# Patient Record
Sex: Female | Born: 1960
Health system: Southern US, Community
[De-identification: ages and names within clinical notes are randomized; demographics above are authoritative.]

## PROBLEM LIST (undated history)

## (undated) DIAGNOSIS — R109 Unspecified abdominal pain: Secondary | ICD-10-CM

## (undated) DIAGNOSIS — M51369 Other intervertebral disc degeneration, lumbar region without mention of lumbar back pain or lower extremity pain: Secondary | ICD-10-CM

## (undated) DIAGNOSIS — L439 Lichen planus, unspecified: Secondary | ICD-10-CM

## (undated) DIAGNOSIS — E119 Type 2 diabetes mellitus without complications: Secondary | ICD-10-CM

## (undated) DIAGNOSIS — E559 Vitamin D deficiency, unspecified: Secondary | ICD-10-CM

## (undated) DIAGNOSIS — M5136 Other intervertebral disc degeneration, lumbar region: Secondary | ICD-10-CM

## (undated) DIAGNOSIS — R1031 Right lower quadrant pain: Secondary | ICD-10-CM

## (undated) DIAGNOSIS — R42 Dizziness and giddiness: Secondary | ICD-10-CM

## (undated) DIAGNOSIS — I83813 Varicose veins of bilateral lower extremities with pain: Secondary | ICD-10-CM

## (undated) DIAGNOSIS — E162 Hypoglycemia, unspecified: Secondary | ICD-10-CM

## (undated) DIAGNOSIS — R002 Palpitations: Secondary | ICD-10-CM

## (undated) DIAGNOSIS — I10 Essential (primary) hypertension: Secondary | ICD-10-CM

## (undated) DIAGNOSIS — D219 Benign neoplasm of connective and other soft tissue, unspecified: Secondary | ICD-10-CM

## (undated) DIAGNOSIS — M797 Fibromyalgia: Secondary | ICD-10-CM

## (undated) DIAGNOSIS — F419 Anxiety disorder, unspecified: Secondary | ICD-10-CM

## (undated) DIAGNOSIS — Z83719 Family history of colon polyps, unspecified: Secondary | ICD-10-CM

## (undated) DIAGNOSIS — K219 Gastro-esophageal reflux disease without esophagitis: Secondary | ICD-10-CM

## (undated) DIAGNOSIS — E041 Nontoxic single thyroid nodule: Secondary | ICD-10-CM

## (undated) DIAGNOSIS — R079 Chest pain, unspecified: Secondary | ICD-10-CM

## (undated) DIAGNOSIS — K5909 Other constipation: Secondary | ICD-10-CM

## (undated) DIAGNOSIS — R1011 Right upper quadrant pain: Secondary | ICD-10-CM

## (undated) DIAGNOSIS — H35033 Hypertensive retinopathy, bilateral: Secondary | ICD-10-CM

## (undated) DIAGNOSIS — N3281 Overactive bladder: Secondary | ICD-10-CM

## (undated) DIAGNOSIS — Z8371 Family history of colonic polyps: Secondary | ICD-10-CM

## (undated) DIAGNOSIS — G894 Chronic pain syndrome: Secondary | ICD-10-CM

## (undated) DIAGNOSIS — M1990 Unspecified osteoarthritis, unspecified site: Secondary | ICD-10-CM

## (undated) DIAGNOSIS — R600 Localized edema: Secondary | ICD-10-CM

## (undated) DIAGNOSIS — R609 Edema, unspecified: Secondary | ICD-10-CM

## (undated) HISTORY — DX: Varicose veins of bilateral lower extremities with pain: I83.813

## (undated) HISTORY — DX: Chest pain, unspecified: R07.9

## (undated) HISTORY — DX: Type 2 diabetes mellitus without complications: E11.9

## (undated) HISTORY — DX: Essential (primary) hypertension: I10

## (undated) HISTORY — PX: OTHER SURGICAL HISTORY: SHX169

## (undated) HISTORY — PX: HYSTEROSCOPY: SHX211

## (undated) HISTORY — DX: Anxiety disorder, unspecified: F41.9

## (undated) HISTORY — DX: Overactive bladder: N32.81

## (undated) HISTORY — DX: Morbid (severe) obesity due to excess calories: E66.01

## (undated) HISTORY — DX: Other constipation: K59.09

## (undated) HISTORY — DX: Gastro-esophageal reflux disease without esophagitis: K21.9

## (undated) HISTORY — DX: Benign neoplasm of connective and other soft tissue, unspecified: D21.9

## (undated) HISTORY — DX: Family history of colonic polyps: Z83.71

## (undated) HISTORY — DX: Nontoxic single thyroid nodule: E04.1

## (undated) HISTORY — DX: Other intervertebral disc degeneration, lumbar region without mention of lumbar back pain or lower extremity pain: M51.369

## (undated) HISTORY — DX: Chronic pain syndrome: G89.4

## (undated) HISTORY — DX: Unspecified abdominal pain: R10.9

## (undated) HISTORY — DX: Palpitations: R00.2

## (undated) HISTORY — DX: Vitamin D deficiency, unspecified: E55.9

## (undated) HISTORY — DX: Family history of colon polyps, unspecified: Z83.719

## (undated) HISTORY — DX: Right lower quadrant pain: R10.31

## (undated) HISTORY — DX: Edema, unspecified: R60.9

## (undated) HISTORY — DX: Right upper quadrant pain: R10.11

## (undated) HISTORY — DX: Hypoglycemia, unspecified: E16.2

## (undated) HISTORY — DX: Hypertensive retinopathy, bilateral: H35.033

## (undated) HISTORY — DX: Localized edema: R60.0

## (undated) HISTORY — DX: Other intervertebral disc degeneration, lumbar region: M51.36

---

## 1999-10-19 ENCOUNTER — Encounter: Payer: Self-pay | Admitting: Family Medicine

## 1999-10-19 ENCOUNTER — Ambulatory Visit (HOSPITAL_COMMUNITY): Admission: RE | Admit: 1999-10-19 | Discharge: 1999-10-19 | Payer: Self-pay | Admitting: Family Medicine

## 2000-08-14 ENCOUNTER — Other Ambulatory Visit: Admission: RE | Admit: 2000-08-14 | Discharge: 2000-08-14 | Payer: Self-pay | Admitting: Obstetrics and Gynecology

## 2000-11-22 ENCOUNTER — Ambulatory Visit (HOSPITAL_COMMUNITY): Admission: RE | Admit: 2000-11-22 | Discharge: 2000-11-22 | Payer: Self-pay | Admitting: Family Medicine

## 2000-11-22 ENCOUNTER — Encounter: Payer: Self-pay | Admitting: Family Medicine

## 2001-11-03 ENCOUNTER — Encounter: Payer: Self-pay | Admitting: Orthopaedic Surgery

## 2001-11-03 ENCOUNTER — Encounter: Admission: RE | Admit: 2001-11-03 | Discharge: 2001-11-03 | Payer: Self-pay | Admitting: Orthopaedic Surgery

## 2002-05-15 ENCOUNTER — Other Ambulatory Visit: Admission: RE | Admit: 2002-05-15 | Discharge: 2002-05-15 | Payer: Self-pay | Admitting: Obstetrics and Gynecology

## 2002-08-24 ENCOUNTER — Encounter (INDEPENDENT_AMBULATORY_CARE_PROVIDER_SITE_OTHER): Payer: Self-pay | Admitting: Specialist

## 2002-08-24 ENCOUNTER — Inpatient Hospital Stay (HOSPITAL_COMMUNITY): Admission: RE | Admit: 2002-08-24 | Discharge: 2002-08-26 | Payer: Self-pay | Admitting: Obstetrics and Gynecology

## 2006-04-22 ENCOUNTER — Encounter: Admission: RE | Admit: 2006-04-22 | Discharge: 2006-04-22 | Payer: Self-pay | Admitting: Obstetrics and Gynecology

## 2006-06-05 ENCOUNTER — Encounter: Admission: RE | Admit: 2006-06-05 | Discharge: 2006-06-05 | Payer: Self-pay | Admitting: Neurology

## 2006-11-29 ENCOUNTER — Encounter
Admission: RE | Admit: 2006-11-29 | Discharge: 2007-02-27 | Payer: Self-pay | Admitting: Physical Medicine & Rehabilitation

## 2006-11-29 ENCOUNTER — Ambulatory Visit: Payer: Self-pay | Admitting: Physical Medicine & Rehabilitation

## 2006-12-02 ENCOUNTER — Ambulatory Visit (HOSPITAL_COMMUNITY)
Admission: RE | Admit: 2006-12-02 | Discharge: 2006-12-02 | Payer: Self-pay | Admitting: Physical Medicine & Rehabilitation

## 2006-12-03 ENCOUNTER — Encounter: Admission: RE | Admit: 2006-12-03 | Discharge: 2006-12-25 | Payer: Self-pay | Admitting: Rheumatology

## 2006-12-24 ENCOUNTER — Ambulatory Visit (HOSPITAL_COMMUNITY)
Admission: RE | Admit: 2006-12-24 | Discharge: 2006-12-24 | Payer: Self-pay | Admitting: Physical Medicine & Rehabilitation

## 2006-12-30 ENCOUNTER — Ambulatory Visit: Payer: Self-pay | Admitting: Physical Medicine & Rehabilitation

## 2007-01-03 ENCOUNTER — Ambulatory Visit: Payer: Self-pay | Admitting: Physical Medicine & Rehabilitation

## 2007-02-05 ENCOUNTER — Ambulatory Visit: Payer: Self-pay | Admitting: Physical Medicine & Rehabilitation

## 2007-02-05 ENCOUNTER — Encounter
Admission: RE | Admit: 2007-02-05 | Discharge: 2007-05-06 | Payer: Self-pay | Admitting: Physical Medicine & Rehabilitation

## 2007-02-19 ENCOUNTER — Ambulatory Visit: Payer: Self-pay | Admitting: Physical Medicine & Rehabilitation

## 2010-06-13 NOTE — Assessment & Plan Note (Signed)
HISTORY:  Jessica [name] returns today.  She had a lumbar epidural syringe  injection December 24, 2006 by a paramedian approach.  No significant  lower extremity improvements.  She is a bit discouraged because of no  perceivable actual improvement.  She does not tolerate narcotic  analgesic medications any stronger than Darvocet.  In fact, Darvocet  makes her tired at N-100 dosage.  Her pain is described as sharp,  burning, dull, stabbing, constant, tingling, aching, currently at 6/10  averaging 8/10 interfering with activity at 7/10 levels.  Sleep is poor-  fair.  Pain improves with rest, heat, pacing activities.  She walks 20  minutes at a time.  She continues to ride a stationary bicycle.  She  gives a goal of returning to work with some limitations or restrictions.  Her pain interferes with meal preparation duties and shopping.  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   REVIEW OF SYSTEMS:  Positive for numbness, tingling, trouble walking,  spasms, depression, anxiety, constipation, abdominal pain and limb  swelling.   SOCIAL HISTORY:  Married.  Lives with her husband and 3 children.   FAMILY HISTORY:  Heart disease, diabetes, high blood pressure.   PHYSICAL EXAMINATION:  GENERAL:  No acute distress.  Mood and affect  appears depressed.  BACK:  Tenderness to palpation bilateral paraspinal.  Her gait shows no  evidence of dragging debility.   IMPRESSION:  Lumbar spinal stenosis.  I did review her MRI images on the  computer as well as the report.  She does have moderate stenosis due to  some shortened pedicles and some facet arthropathy.  She may get some  intermittent radicular symptoms due to this.  Because of this, I think  she can use intermittent sitting interspersed with her walking, just  sitting a few minutes every half hour should help with this.  Certainly  she does have an overlay of her fibromyalgia and this is playing into  perceived disability.   PLAN:  1. As I  discussed with the patient, she really needs about another 3      acupuncture visits to fully assess this modality in terms of its      efficacy.  2. Change Darvocet from N-100 to N-50 and increase to q.i.d.  3. I did write her for alternating standing/sitting and no lifting      greater than 20 pounds in terms of work restrictions.  4. She asked me to get her a permanent handicap placard, however, I do      not think that she has sufficient claudication symptoms to warrant      this at the current time.  Should she progress, she may require      this and also consider surgical opinion.      Erick Colace, M.D.  Electronically Signed     AEK/MedQ  D:  01/14/2007 12:18:36  T:  01/14/2007 17:36:10  Job #:  [id number]   cc:   Jessica Skains, MD

## 2010-06-13 NOTE — Group Therapy Note (Signed)
REASON FOR CONSULTATION:  Back pain, knee pain as well as fibromyalgia  pain.   HISTORY:  The patient is a 50 year old female who gives a history of  pain all over the body dating back at least 6 years.   She had initial consultation with Rheumatology in 2005 and at that time  was diagnosed with fibromyalgia syndrome.  In 2001 she did have problems  with knee pain and underwent knee joint arthroscopic surgery for a torn  meniscus.  Her pain spread to other joints and at that point she did  present to Rheumatology.  No other inflammatory rheumatologic condition  was diagnosed at that time.   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  She has in the past gone to [facility] but  is already scheduled to go to [facility].  Her main  complaints today are back pain as well as knee pain.  She has had some  back and hip x-rays at Dr. Fatima Sanger office.  She has had knee x-rays  in the past but it has been years since they were done.   MEDICATIONS TRIED:  She has been on Elavil, Mobic, Flexeril, Wellbutrin,  Lyrica.   CURRENT MEDS:  1. Neurontin 300 mg daily.  2. Cymbalta 60 mg a day.  3. Flexeril 10 mg t.i.d. p.r.n.  4. Darvocet one p.o. up to 4 times per day p.r.n.  5. Butalbital up to q.i.d. for headache.   NON-PAIN MEDICATIONS:  Allegra, meclizine and Maxzide.   ALLERGIES:  1. MORPHINE.  2. FENTANYL.  3. DEMEROL.  4. VICODIN.  5. PERCOCET.  6. ULTRAM.  7. CORTISONE INJECTIONS.  8. LYRICA.  LYRICA CAUSES EDEMA OF THE HANDS AND FEET.   CURRENT EXERCISE PROGRAM:  She does do some treadmill.  She has done  some swimming in the past but no aquatic therapy per se.   She has a walking tolerance of 20 to 30 minutes, but beyond this seems  to aggravate her pain.   FUNCTIONAL REVIEW:  Meal prep, household duties, shopping are difficult  for her because of pain in the back and knees.   REVIEW OF SYSTEMS:   Positive for trouble walking, spasms, dizziness,  confusion, depression, numbness, tingling, constipation, abdominal pain,  poor appetite, limb swelling and coughing as well as weight gain.   PAST SURGICAL HISTORY:  1. C-section 1993, 1998.  2. Hysterectomy in 2004.   SOCIAL HISTORY:  1. Married.  2. Lives with her husband and 3 children.   FAMILY HISTORY:  1. Heart disease.  2. Diabetes.  3. Hypertension.   EXAMINATION:  Blood pressure 133/66, pulse 72, respirations 18, O2 sat  99% on room air.  GENERAL:  No acute distress.  Mood and affect appropriate.  Obese  female.  She has negative palpation for tender points of fibromyalgia.  She has  good knee range of motion, no evidence of effusion.  She does have some  valgus deformity bilateral knees.  BACK:  Has some tenderness to palpation lumbar paraspinals.  Her forward  flexion is 50% forward flexion and 50% extension.  Her deep tendon  reflexes are 1+ bilateral biceps, triceps, brachioradialis, patellar and  ankle.  Her hip range of motion is good.  Her pulses in her feet are  normal, foot temperature and skin color are normal, nailbeds are normal.   IMPRESSION:  Chronic back pain as well as knee pain.  The back pain  appears to be more axial and some radiation into the thighs, this could  be either facet syndrome sacroiliac joint and less likely diskogenic,  although that still remains in the differential.   She has had no MRI.  She has had recent x-rays but I do not have those  results.  Looking back, she has had pelvic x-rays showing some  sacroiliac sclerotic margins, mild sacroiliitis diagnosed in 2003.  She  has had thoracic spine films.  She has had a bone scan which indicated  some increased activity in the sacroiliac joint area.   Knee films in 2001 were unremarkable.   PLAN:  1. Agree with physical therapy, will monitor program, make medication      adjustments as needed, avoid narcotic analgesics given  multiple      allergies.  Discussed acupuncture as potential treatment modality.      She will check into her insurance benefits __________ does perform.  2. I will give her some samples of Flector patch she can use over her      low back area, see if this is helpful for her, on a b.i.d. basis,      especially as she is going through physical therapy.  3. Should her back pain not improve would check lumbar MRI and      consider sacroiliac injections.  4. In terms of her fibromyalgia, she may benefit from an aquatic      exercise program . Fibromyalgia Does not appear to be active at      this time.      Erick Colace, M.D.  Electronically Signed     AEK/MedQ  D:  12/02/2006 10:07:02  T:  12/02/2006 17:20:55  Job #:  [id number]   cc:   Pollyann Savoy, M.D.  Fax: [phone]   Melida Quitter, M.D.  Fax: [phone]

## 2010-06-13 NOTE — Procedures (Signed)
[name], Jessica [name]               ACCOUNT NO.:  [id number]   MEDICAL RECORD NO.:  [id number]          PATIENT TYPE:  OUT   LOCATION:  XRAY                         FACILITY:  [facility]   PHYSICIAN:  Erick Colace, M.D.DATE OF BIRTH:  Dec 01, 1960   DATE OF PROCEDURE:  12/23/2006  DATE OF DISCHARGE:  12/02/2006                               OPERATIVE REPORT   PROCEDURE:  Acupuncture treatment.   DESCRIPTION OF PROCEDURE:  Needles placed:  Bilateral BL was moved, 21BL  was moved, 23BL was moved, 25BL, 27, as well as GB30 bilaterally.  4  hertz stimulation x30 minutes.  The patient tolerated the procedure  well.  She will return in 1 week for repeat treatment.      Erick Colace, M.D.  Electronically Signed     AEK/MEDQ  D:  12/23/2006 10:13:51  T:  12/23/2006 11:28:34  Job:  [id number]

## 2010-06-13 NOTE — Assessment & Plan Note (Signed)
A 50 year old female who gives history of pain all over his body.  Diagnosed with fibromyalgia in 2005.  Has had knee pain, torn meniscus  in the past.  I sent her for x-rays of her knee.  She showed moderate  degree of DJD for her age.   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  Dr. Corliss Skains apparently had her off  work.  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  She has had no problems with  activities of daily living other than meal prep and shopping and  household duties.   REVIEW OF SYSTEMS:  Thighs are tingling, trouble walking, spasms,  dizziness, anxiety, night sweats, constipation, coughing at night.   She is currently out since 11/22/06 and was started on reduced schedule  since 04/2006.   Her back has no tenderness to palpation.  She has some back pain with  Farber's maneuver on the left side greater than the right side.  She has  had previous x-rays showing sacral iliac sclerosis.   She has pain with extension of the lumbar spine but not with flexion.   PHYSICAL EXAMINATION:  VITALS:  Blood pressure 142/65,  pulse 71,  respirations 18, O2 sat 100% on room air.  GENERAL:  No acute distress.  Mood and affect appropriate.  SKIN:  Normal.  EXTREMITIES: Gait is normal.  MUSCULOSKELETAL:  Body habitus obese, she has valgus deformities at the  knees.  She has some crepitus at the knees but no evidence of effusion,  erythema or induration.   IMPRESSION:  1. Fibromyalgia syndrome.  I think this is her overall pain process.      Did tell her that I do not think that this is  a long term      disabling condition.  She is unable to take any narcotic analgesics      other than Darvocet which makes her drowsy certainly we can look      for other non medication alternatives and I have suggested      acupuncture trial which she would like to try.  2. Back pain.  Has evidence of 2003 x-ray of sacral iliac sclerosis.      May have some facet arthropathy or degenerative disk.  Check an       MRI.  3. Knee pain with moderate DJD.  This is not a major complaint.  Do      not think she needs injections for it. Have counseled weight loss      as probably the best thing she can do for her knees.   Agreed to write a note that she is undergoing pain management she should  reach maximal medical improvement in approximately 1 month after which I  do not think she will need any restrictions since she has undergone PT  and some other treatments allowing her to focus on these over the next  month.      Erick Colace, M.D.  Electronically Signed     AEK/MedQ  D:  12/12/2006 14:51:55  T:  12/13/2006 07:03:16  Job #:  [id number]

## 2010-06-13 NOTE — Procedures (Signed)
Jessica [name], Jessica [name]               ACCOUNT NO.:  [id number]   MEDICAL RECORD NO.:  [id number]           PATIENT TYPE:   LOCATION:                                 FACILITY:   PHYSICIAN:  Erick Colace, M.D.DATE OF BIRTH:  1960-04-17   DATE OF PROCEDURE:  DATE OF DISCHARGE:                               OPERATIVE REPORT   PROCEDURE:  Acupuncture treatment.   OPERATOR:   NEEDLES PLACED/DESCRIPTION OF PROCEDURE:  Bilateral BL21, BL23, BL25 and  BL27, as well as GB30 prime bilaterally, for her skin x30 minutes.  The  patient tolerated the procedure well.   FOLLOWUP:  She is to return in one week.      Erick Colace, M.D.  Electronically Signed     AEK/MEDQ  D:  12/16/2006 09:25:21  T:  12/16/2006 12:56:06  Job:  [id number]

## 2010-06-13 NOTE — Procedures (Signed)
Jessica [name], Jessica [name]               ACCOUNT NO.:  [id number]   MEDICAL RECORD NO.:  [id number]          PATIENT TYPE:  REC   LOCATION:  TPC                          FACILITY:  [facility]   PHYSICIAN:  Erick Colace, M.D.DATE OF BIRTH:  09-17-1960   DATE OF PROCEDURE:  01/20/2007  DATE OF DISCHARGE:                               OPERATIVE REPORT   PROCEDURE:  Acupuncture treatment.   Needles were placed bilaterally at BL21, BL23, BL25, BL27, and GB30.  Each 4 Hertz x 30 minutes.  The patient tolerated the procedure well.      Erick Colace, M.D.  Electronically Signed     AEK/MEDQ  D:  01/20/2007 16:07:33  T:  01/20/2007 23:11:15  Job:  [id number]

## 2010-06-13 NOTE — Procedures (Signed)
NAMEVERNICA, Jessica [name] NO.:  [id number]   MEDICAL RECORD NO.:  [id number]          PATIENT TYPE:  OUT   LOCATION:  XRAY                         FACILITY:  [facility]   PHYSICIAN:  Erick Colace, M.D.DATE OF BIRTH:  [birth year]   DATE OF PROCEDURE:  DATE OF DISCHARGE:  12/24/2006                               OPERATIVE REPORT   PROCEDURE:  Lumbar epidural steroid injection, L4-5 translaminar, right  paramedian approach.   Because of history of itching that she associated with prior hip  corticosteroid injection, she was given 50 mg and Benadryl IM.  This was  after she had received informed consent which explained risks and  benefits of the procedure including bleeding, bruising, infection,  temporary or permanent paralysis.  She elects to proceed and has given  written consent.  The patient placed prone on fluoroscopy table.  Betadine prep, sterile drape.  25-gauge 1-1/2-inch needle was used to  anesthetize the skin and subcutaneous tissue with 1% lidocaine x2 mL.  Then an 18-gauge 6-inche spinal needle was inserted under fluoroscopic  guidance and AP lateral imaging.  Loss of resistance technique was  employed after inferior lamina of L4 was reached and needle was directed  inferiorly.  After loss of resistance was obtained, Omnipaque 180  demonstrated epidural spread, mainly right-sided, no intravascular  uptake, and then a solution containing 1 mL of 1% lidocaine, 1.5 mL of  40 mg/mL Depo-Medrol, and 1 mL of 0.9 normal saline, preservative-free,  were injected.  The patient tolerated the procedure well.  Postinjection  vitals stable.  Postinjection instructions given.  I will see her back  in 1 week.  She has an acupuncture appointment.      Erick Colace, M.D.  Electronically Signed     AEK/MEDQ  D:  01/06/2007 13:00:10  T:  01/06/2007 16:28:30  Job:  [id number]

## 2010-06-13 NOTE — Assessment & Plan Note (Signed)
The patient returns today.  She follows up from a trial of acupuncture.  We did a total of seven visits, last one on February 06, 2007.  She has  not had any significant relief with the acupuncture treatment.  Her pain  is twofold, one is the low back pain that radiates into the hips and  thighs, and then she has the all over body pain that she has had related  to her fibromyalgia.  Her current pain is 5/10 but averages at 9/10.  She feels she cannot work due to her pain.  Her pain does improve  briefly with exercise for about an hour post exercise.  She has been  doing exercises such as water aerobics as well as some aerobic activity.   Other medications tried in the past that she is unable to tolerate  include morphine, fentanyl, Demerol, Vicodin, Percocet, Ultram,  cortisone injection, and Lyrica.  She did, however, tolerate epidural  steroid injection which did not cause any adverse side effects.  She has  had some swelling with Neurontin.   She last worked November 22, 2006.  She states that she sometimes needs  help with meal prep, shopping, and household duties.   REVIEW OF SYSTEMS:  14-point health and history performed, please  review.  Pertinent positives include numbness in the feet and hands,  tingling, trouble walking fast, and confusion, depression and anxiety,  but negative for suicidal thoughts.  She has had some weight gain,  nausea, constipation, abdominal pain, coughing at night, as well as  swelling in her legs.   SOCIAL HISTORY:  She is married and lives with her husband and three  children.   CURRENT MEDICATIONS:  1. Cymbalta 60 mg a day.  2. Flexeril 10 mg up to three at night.  3. Darvocet-N 50 that she takes q.i.d.   PHYSICAL EXAMINATION:  GENERAL:  Morbidly obese female in no acute  distress.  Mood and affect are appropriate, although a bit frustrated.  Gait; no toe drag or knee instability, widened base support due to thigh  circumference.  She has  tenderness in the fibromyalgia tender points up  the traps, sternocostal, low back, hip, but not in the elbows or knees.  She has pain with extension of her lumbar spine.  She has full forward  flexion without pain of her lumbar spine.  She has normal strength in  the deltoid, biceps, triceps, grip, as well as hip flexor, knee  extension, and ankle dorsiflexor.  She has normal deep tendon reflexes  bilateral lower and upper extremities.   IMPRESSION:  Chronic pain which can be attributed to two causes:  1. Overlying causes of compartment myalgia syndrome for all over body      pain.  She is doing aerobic activity as is required using very      small dose of narcotic analgesic, i.e., Darvocet N 50.  She is on      Cymbalta.  She cannot tolerate Lyrica.  She also cannot tolerate      Neurontin even though Topamax has not been really well studied in      fibromyalgia population, it may have some beneficial effect in this      situation and may have an added effect of some weight loss.  We      will start with 25 q.h.s. and gradually increase to 50 q.h.s. next      time I see her and then start daytime dosages.  2. No  further acupuncture treatments are needed.  3. In terms of lumbar stenosis, this is partially related to facet      arthropathy and this may be causing her pain with hyperextension.      The stenosis is in the mild to moderate range would not recommend      surgical referral at this point.  She may benefit from lumbar      medial branch blocks.  We discussed return to work.  I do think she      can      do a sedentary type job.  She does not feel she can.  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  I will see her back for the      lumbar medial branch blocks in two weeks.      Erick Colace, M.D.  Electronically Signed     AEK/MedQ  D:  02/20/2007 17:41:14  T:  02/20/2007 23:08:02  Job #:  [id number]   cc:   Kathryne Hitch, MD  Fax: [phone]

## 2010-06-13 NOTE — Assessment & Plan Note (Signed)
[name] returns today, she has lumbar pain and lower extremity pain.  She had a recent lumbar MRI and would like to review this today.  She  has had some relief from Flector patches in terms of her back pain, this  has not helped her foot pain.  She describes her pain as sharp, burning,  intermittent, dull stabbing, and tingling, aching.  She has all over  body pain, but also more particular pain in the low back and buttock and  posterior thigh area.  Her sleep is fair.  Pain improves with rest,  heat, therapy, pacing activity, TENS unit.  Increased with walking and  prolonged sitting and prolonged standing.  Pain interferes with meal  prep, household duties and shopping.   REVIEW OF SYSTEMS:  Positive for trouble walking, spasms, dizziness,  depression, anxiety.  She also has constipation, and extremity swelling  on review.   SOCIAL HISTORY:  Married.  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  Last worked 11/22/2006.   EXAMINATION:  LOWER EXTREMITY:  Lower extremity strength is normal.  Gait shows no evidence of toe dragging or instability.  No lower  extremity swelling noted.  Her pulses are normal in lower extremities.  Sensation is hyperesthetic in the feet compared to the knees.  DEEP TENDON REFLEXES:  Normal.   Review of MRI shows bilateral facet joint change at L3-4; congenitally  short pedicles; mild to moderate spinal stenosis at L3-4 level, small  right posterior lateral disk protrusion.  At L4-5, she has ligamentum  flavum hypertrophied in conjunction with facet degenerative changes, and  short pedicles; moderate stenosis at this level with mild bilateral  foraminal narrowing.  L5-S1, moderate facet joint changes, mild bulge,  lateral recess stenosis, and mild bilateral foraminal narrowing.   IMPRESSION:  Lumbar spinal stenosis, lower extremity numbness can be  attributed to radicular symptoms.  She is already getting some  acupuncture for her low back and  she has multiple medication allergies  including basically all narcotic analgesics, except for Darvocet.  She  is taking Neurontin already as well as Cymbalta.  Will schedule for an  L4-5 translaminar epidural steroid injection.  She had some itching  after a hip cortisone injection.  Will give her IM Benadryl prior to the  injection and she can take p.o. Benadryl when she gets home and for 24  hours afterwards.  Her prior itching episode after corticosteroid  improved after 24 hours of Benadryl p.o. only.      Jessica [name], M.D.  Electronically Signed     AEK/MedQ  D:  12/31/2006 11:26:56  T:  12/31/2006 12:39:42  Job #:  [id number]

## 2010-06-13 NOTE — Procedures (Signed)
[name], Jessica [name]               ACCOUNT NO.:  [id number]   MEDICAL RECORD NO.:  [id number]           PATIENT TYPE:   LOCATION:                                 FACILITY:   PHYSICIAN:  Erick Colace, M.D.DATE OF BIRTH:  08/11/1960   DATE OF PROCEDURE:  DATE OF DISCHARGE:                               OPERATIVE REPORT   PROCEDURE:  Acupuncture treatment.   OPERATOR:  Dr. Erick Colace.   DESCRIPTION OF PROCEDURE:  The needles were placed bilaterally at BL-21,  BL-23, BL-25, BL-27, GB-30 and BL-37, 4 Hz stim x30 minutes.  The  patient tolerated the procedure well.      Erick Colace, M.D.  Electronically Signed     AEK/MEDQ  D:  12/31/2006 11:21:53  T:  12/31/2006 11:52:28  Job:  [id number]

## 2010-06-13 NOTE — Procedures (Signed)
Jessica [name], Jessica [name]               ACCOUNT NO.:  [id number]   MEDICAL RECORD NO.:  [id number]          PATIENT TYPE:  REC   LOCATION:  TPC                          FACILITY:  [facility]   PHYSICIAN:  Erick Colace, M.D.DATE OF BIRTH:  [birth year]   DATE OF PROCEDURE:  02/06/2007  DATE OF DISCHARGE:                               OPERATIVE REPORT   PROCEDURE:  Acupuncture treatment #7.   PROCEDURE:  Needle was placed bilaterally at BL-21, BL-23, BL-25, BL-27,  BL-52, GB-30, and DU-4 using 2 Hz x30 minutes.  The patient tolerated  the procedure well and will return in one week.   INDICATIONS FOR PROCEDURE:  Lumbar spinal stenosis as well as  arthromyalgia unresponsive to medical management.      Erick Colace, M.D.  Electronically Signed     AEK/MEDQ  D:  02/06/2007 09:52:30  T:  02/06/2007 10:52:36  Job:  [id number]

## 2010-06-13 NOTE — Assessment & Plan Note (Signed)
Jessica [name] is a 50 year old female with pain all over her body.  Diagnosed with fibromyalgia syndrome.      Erick Colace, M.D.  Electronically Signed     AEK/MedQ  D:  12/12/2006 16:27:01  T:  12/13/2006 11:27:27  Job #:  [id number]

## 2010-06-13 NOTE — Procedures (Signed)
[name], [name] NO.:  [id number]   MEDICAL RECORD NO.:  [id number]          PATIENT TYPE:  OUT   LOCATION:  XRAY                         [facility]ACILITY:  [facility]   PHYSICIAN:  Erick Colace, M.D.DATE OF BIRTH:  [birth year]   DATE OF PROCEDURE:  01/14/2007  DATE OF DISCHARGE:                               OPERATIVE REPORT   PROCEDURE:  Acupuncture treatment, number 4.   This treatment is for lumbar spinal stenosis with resulting back and hip  pain.  The patient was placed on exam table in a prone position.  Needles were placed bilaterally at BL21, BL23, BL25, BL27.  Also, GB30  bilaterally.  Each stim 4 hertz x30 minutes.  The patient tolerated the  procedure well.      Erick Colace, M.D.  Electronically Signed     AEK/MEDQ  D:  01/14/2007 12:13:53  T:  01/14/2007 15:45:21  Job:  [id number]

## 2010-06-13 NOTE — Procedures (Signed)
NAMEETHLEEN, LORMAND               ACCOUNT NO.:  [id number]   MEDICAL RECORD NO.:  [id number]           PATIENT TYPE:   LOCATION:                                 FACILITY:   PHYSICIAN:  Erick Colace, M.D.DATE OF BIRTH:  July 02, 1960   DATE OF PROCEDURE:  01/28/2007  DATE OF DISCHARGE:                               OPERATIVE REPORT   ACUPUNCTURE TREATMENT:  Six needles were placed bilaterally at BL 21, BL  23, BL 25,  BL 27,  GB 30 as well as right Shen-men to her stimulation  times 30 minutes.  The patient tolerated procedure well.  Return in 1  week.  Pain level today 7/10.  Pain last visit was 8/10.      Erick Colace, M.D.  Electronically Signed     AEK/MEDQ  D:  01/28/2007 10:06:48  T:  01/28/2007 10:37:47  Job:  [id number]

## 2010-06-16 NOTE — Discharge Summary (Signed)
   NAMEAKIERA, Jessica [name]                         ACCOUNT NO.:  [id number]   MEDICAL RECORD NO.:  [id number]                   PATIENT TYPE:  INP   LOCATION:  9304                                 FACILITY:  [facility]   PHYSICIAN:  Cynthia P. Romine, M.D.             DATE OF BIRTH:  October 29, 1960   DATE OF ADMISSION:  08/24/2002  DATE OF DISCHARGE:  08/26/2002                                 DISCHARGE SUMMARY   DISCHARGE DIAGNOSIS:  Menorrhagia and uterine fibroids.   PROCEDURE:  Total abdominal hysterectomy.   HISTORY:  This is a 50 year old black female who complained of menorrhagia  unresponsive to medical management, was known to have uterine fibroid, who  was admitted to undergo total abdominal hysterectomy.  On August 24, 2002 she  underwent a total abdominal hysterectomy under general endotracheal  anesthesia.  Estimated blood loss was 400 mL; there were no complications.  Postoperatively she did very well and had an uneventful postoperative  course, and was in satisfactory condition for discharge on postoperative day  #2.  She was given full discharge instructions regarding pelvic rest, follow-  up in the office in six weeks.  She was given a prescription for Toradol  tablets to take one p.o. q.6h. p.r.n. pain, was advised to follow up in the  office in six weeks.  Pathology report showed leiomyomata and benign  secretory endometrium.  The uterus was 14 x 10 x 8 cm and weighed 473 grams.   LABORATORY DATA:  Admission H&H was 10 and 34, and on discharge 9 and 28.                                                Cynthia P. Romine, M.D.    CPR/MEDQ  D:  09/14/2002  T:  09/15/2002  Job:  [id number]

## 2010-06-16 NOTE — Op Note (Signed)
Jessica [name], Jessica [name]                         ACCOUNT NO.:  [id number]   MEDICAL RECORD NO.:  [id number]                   PATIENT TYPE:  INP   LOCATION:  9304                                 FACILITY:  [facility]   PHYSICIAN:  Cynthia P. Romine, M.D.             DATE OF BIRTH:  21-Oct-1960   DATE OF PROCEDURE:  08/24/2002  DATE OF DISCHARGE:                                 OPERATIVE REPORT   PREOPERATIVE DIAGNOSES:  1. Menorrhagia.  2. Uterine fibroids.  3. Anemia.   POSTOPERATIVE DIAGNOSES:  1. Menorrhagia.  2. Uterine fibroids.  3. Anemia.  4. Pathology pending.   PROCEDURE:  Total abdominal hysterectomy.   SURGEON:  Cynthia P. Romine, M.D.   ASSISTANT:  Laqueta Linden, M.D.   ANESTHESIA:  General endotracheal anesthesia.   ESTIMATED BLOOD LOSS:  400 mL.   COMPLICATIONS:  None.   DESCRIPTION OF PROCEDURE:  The patient was taken to the operating room and  after the induction of adequate general endotracheal anesthesia, was prepped  and draped in the usual fashion and Foley catheter inserted.  A Pfannenstiel  incision was made at the site of the previous two Pfannenstiels.  The  incision was carried down to the fascia with the Bovie.  The fascia was  nicked and opened transversely using the Mayo scissors.  Kochers were used  to grasp the fascia __________ underlying rectus muscles with sharp  dissection.  Rectus muscles were separated sharply in the midline.  The  underlying peritoneum was elevated and entered atraumatically.  The  peritoneum was opened vertically.  The abdomen was explored.  The liver edge  was smooth.  There was no pelvic or periaortic adenopathy.  In the pelvis,  the uterus was enlarged to about 12-week size.  Fundus was firm, consistent  with a known submucous myoma of 5 cm.  The ovaries and tubes appeared  normal.  Long Kellys were placed at the uterine cornua to elevate the uterus  out of the pelvis.  The round ligaments were sutured on each side  and  divided with the Bovie. Anterior leaf of the broad ligament was taken down  sharply.  Some adhesions of the bladder up to the anterior cervical uterus  were also taken down sharply.  The pedicle containing tube and utero-ovarian  ligament was clamped on each side, cut and doubly tied, achieving good  hemostasis.  The posterior leaf of the broad ligament was also taken down  sharply.  Ureters were identified bilaterally and were intact.  The bladder  was dissected free off the cervix.  The uterine arteries were skeletonized,  clamped, cut and doubly tied.  The cardinal ligament was clamped, cut and  tied down to the level of the uterosacral ligaments which were clamped, cut  and tied and held as separate pedicle.  The vagina was entered anteriorly  and Mayo scissors were used to remove the specimen and  specimen was sent to  pathology.  Kochers were used to grasp the margins of the vagina.  Angled  sutures were placed on the right and left starting from the inside of the  vagina through the uterosacral ligament pedicle and then back to the inside  of the vagina and tying.  The vagina was the closed with running sutures of  figure-of-eight using 0 chromic.  The ovaries were then tied up to the round  ligaments on each side to elevate the ovaries out of the cul-de-sac.  Ovarian pedicles were hemostatic.  The pelvis was inspected.  Again, the  ureters were identified and felt to be normal. There was some bleeding along  the anterior margins of the bladder flap on the patient's left which was  controlled with two figure-of-eight sutures of 2-0 chromic.  The pelvis was  irrigated and felt to be hemostatic.  The packs were removed.  The  peritoneum was closed with a running suture of 2-0 chromic.  Fascia was  closed with a running suture of 0 Vicryl going from each angle toward the  midline.  The on-Q subfascial catheter had been placed prior to closing the  fascia.  After closing the fascia,  the subcutaneous catheter of the on-Q  system was placed.  The subcutaneous tissue was irrigated and was  hemostatic.  The skin was closed with subcuticular stitch of 3-0 Vicryl  Rapide.  Steri-Strips were placed.  Op-Sites were placed over the on-Q  catheters and dressing was applied.  The patient tolerated the procedure  well and went in satisfactory condition to post anesthesia recovery.                                               Cynthia P. Romine, M.D.    CPR/MEDQ  D:  08/24/2002  T:  08/24/2002  Job:  [id number]

## 2011-01-08 ENCOUNTER — Other Ambulatory Visit: Payer: Self-pay | Admitting: Family Medicine

## 2011-01-08 DIAGNOSIS — Z1231 Encounter for screening mammogram for malignant neoplasm of breast: Secondary | ICD-10-CM

## 2011-01-09 ENCOUNTER — Ambulatory Visit
Admission: RE | Admit: 2011-01-09 | Discharge: 2011-01-09 | Disposition: A | Discharge status: Home / Self Care | Payer: Self-pay | Source: Ambulatory Visit | Attending: Family Medicine | Admitting: Family Medicine

## 2011-01-09 DIAGNOSIS — Z1231 Encounter for screening mammogram for malignant neoplasm of breast: Secondary | ICD-10-CM

## 2011-12-08 ENCOUNTER — Emergency Department (INDEPENDENT_AMBULATORY_CARE_PROVIDER_SITE_OTHER): Payer: Medicare PPO

## 2011-12-08 ENCOUNTER — Emergency Department (HOSPITAL_COMMUNITY)
Admission: EM | Admit: 2011-12-08 | Discharge: 2011-12-08 | Disposition: A | Discharge status: Home / Self Care | Payer: Medicare PPO | Attending: Emergency Medicine | Admitting: Emergency Medicine

## 2011-12-08 ENCOUNTER — Encounter (HOSPITAL_COMMUNITY): Payer: Self-pay

## 2011-12-08 ENCOUNTER — Encounter (HOSPITAL_COMMUNITY): Payer: Self-pay | Admitting: Physical Medicine and Rehabilitation

## 2011-12-08 ENCOUNTER — Emergency Department (HOSPITAL_COMMUNITY): Payer: Medicare PPO

## 2011-12-08 ENCOUNTER — Emergency Department (INDEPENDENT_AMBULATORY_CARE_PROVIDER_SITE_OTHER)
Admission: EM | Admit: 2011-12-08 | Discharge: 2011-12-08 | Disposition: A | Discharge status: Other Acute Inpatient Hospital | Payer: Medicare PPO | Source: Home / Self Care | Attending: Family Medicine | Admitting: Family Medicine

## 2011-12-08 DIAGNOSIS — R1011 Right upper quadrant pain: Secondary | ICD-10-CM

## 2011-12-08 DIAGNOSIS — Z791 Long term (current) use of non-steroidal anti-inflammatories (NSAID): Secondary | ICD-10-CM | POA: Insufficient documentation

## 2011-12-08 DIAGNOSIS — Z8619 Personal history of other infectious and parasitic diseases: Secondary | ICD-10-CM | POA: Insufficient documentation

## 2011-12-08 DIAGNOSIS — IMO0002 Reserved for concepts with insufficient information to code with codable children: Secondary | ICD-10-CM | POA: Insufficient documentation

## 2011-12-08 DIAGNOSIS — M1990 Unspecified osteoarthritis, unspecified site: Secondary | ICD-10-CM | POA: Insufficient documentation

## 2011-12-08 DIAGNOSIS — M797 Fibromyalgia: Secondary | ICD-10-CM | POA: Insufficient documentation

## 2011-12-08 DIAGNOSIS — IMO0001 Reserved for inherently not codable concepts without codable children: Secondary | ICD-10-CM | POA: Insufficient documentation

## 2011-12-08 DIAGNOSIS — X58XXXA Exposure to other specified factors, initial encounter: Secondary | ICD-10-CM | POA: Insufficient documentation

## 2011-12-08 DIAGNOSIS — T148XXA Other injury of unspecified body region, initial encounter: Secondary | ICD-10-CM

## 2011-12-08 DIAGNOSIS — Y929 Unspecified place or not applicable: Secondary | ICD-10-CM | POA: Insufficient documentation

## 2011-12-08 DIAGNOSIS — L439 Lichen planus, unspecified: Secondary | ICD-10-CM | POA: Insufficient documentation

## 2011-12-08 DIAGNOSIS — Y939 Activity, unspecified: Secondary | ICD-10-CM | POA: Insufficient documentation

## 2011-12-08 DIAGNOSIS — R42 Dizziness and giddiness: Secondary | ICD-10-CM | POA: Insufficient documentation

## 2011-12-08 HISTORY — DX: Dizziness and giddiness: R42

## 2011-12-08 HISTORY — DX: Unspecified osteoarthritis, unspecified site: M19.90

## 2011-12-08 HISTORY — DX: Lichen planus, unspecified: L43.9

## 2011-12-08 HISTORY — DX: Fibromyalgia: M79.7

## 2011-12-08 LAB — COMPREHENSIVE METABOLIC PANEL
ALT: 17 U/L (ref 0–35)
Albumin: 4.2 g/dL (ref 3.5–5.2)
Alkaline Phosphatase: 83 U/L (ref 39–117)
BUN: 12 mg/dL (ref 6–23)
Chloride: 102 mEq/L (ref 96–112)
Potassium: 4.7 mEq/L (ref 3.5–5.1)
Sodium: 140 mEq/L (ref 135–145)
Total Bilirubin: 0.3 mg/dL (ref 0.3–1.2)
Total Protein: 8.1 g/dL (ref 6.0–8.3)

## 2011-12-08 LAB — CBC WITH DIFFERENTIAL/PLATELET
Basophils Relative: 0 % (ref 0–1)
Eosinophils Absolute: 0.1 10*3/uL (ref 0.0–0.7)
Hemoglobin: 12.4 g/dL (ref 12.0–15.0)
Lymphs Abs: 1.8 10*3/uL (ref 0.7–4.0)
Monocytes Relative: 6 % (ref 3–12)
Neutro Abs: 2.3 10*3/uL (ref 1.7–7.7)
Neutrophils Relative %: 51 % (ref 43–77)
Platelets: 245 10*3/uL (ref 150–400)
RBC: 4.87 MIL/uL (ref 3.87–5.11)

## 2011-12-08 LAB — POCT URINALYSIS DIP (DEVICE)
Bilirubin Urine: NEGATIVE
Glucose, UA: NEGATIVE mg/dL
Leukocytes, UA: NEGATIVE
Nitrite: NEGATIVE
Urobilinogen, UA: 0.2 mg/dL (ref 0.0–1.0)

## 2011-12-08 LAB — LIPASE, BLOOD: Lipase: 16 U/L (ref 11–59)

## 2011-12-08 MED ORDER — IOHEXOL 350 MG/ML SOLN
100.0000 mL | Freq: Once | INTRAVENOUS | Status: AC | PRN
  Administered 2011-12-08: 100 mL via INTRAVENOUS

## 2011-12-08 MED ORDER — SODIUM CHLORIDE 0.9 % IV SOLN
INTRAVENOUS | Status: DC
  Administered 2011-12-08: 14:00:00 via INTRAVENOUS

## 2011-12-08 MED ORDER — METHOCARBAMOL 500 MG PO TABS: 500.0000 mg | ORAL_TABLET | Freq: Two times a day (BID) | ORAL | Status: DC | PRN

## 2011-12-08 MED ORDER — LORAZEPAM 2 MG/ML IJ SOLN
1.0000 mg | Freq: Once | INTRAMUSCULAR | Status: AC
  Administered 2011-12-08: 1 mg via INTRAVENOUS
  Filled 2011-12-08: qty 1

## 2011-12-08 NOTE — ED Notes (Signed)
Report called to Aundra Millet, ed triage nurse. Pt transported by shuttle.

## 2011-12-08 NOTE — ED Provider Notes (Signed)
History     CSN: [id number]  Arrival date & time 12/08/11  3086   First MD Initiated Contact with Patient 12/08/11 1114      Chief Complaint  Patient presents with  . Flank Pain    rt flank pain, rt hand numbness    (Consider location/radiation/quality/duration/timing/severity/associated sxs/prior treatment) Patient is a 51 y.o. female presenting with back pain. The history is provided by the patient.  Back Pain  This is a new problem. The current episode started more than 2 days ago. The problem occurs daily. The problem has been gradually worsening. The pain is associated with no known injury. Pain location: right lateral. The quality of the pain is described as aching. The pain does not radiate. The pain is moderate. The symptoms are aggravated by certain positions, twisting and bending. The pain is the same all the time. Pertinent negatives include no chest pain, no fever and no abdominal pain. She has tried NSAIDs for the symptoms. The treatment provided mild relief. Risk factors include obesity, lack of exercise and a sedentary lifestyle.    Past Medical History  Diagnosis Date  . Fibromyalgia   . Osteoarthritis   . Osteoarthritis   . Vertigo   . Lichen planus     Past Surgical History  Procedure Date  . Hysteroscopy   . Arthroscopic knee   . Cesarean section     No family history on file.  History  Substance Use Topics  . Smoking status: Never Smoker   . Smokeless tobacco: Never Used  . Alcohol Use:     OB History    Grav Para Term Preterm Abortions TAB SAB Ect Mult Living                  Review of Systems  Constitutional: Negative for fever.  Cardiovascular: Negative for chest pain.  Gastrointestinal: Positive for nausea and vomiting. Negative for abdominal pain.  Musculoskeletal: Positive for back pain and arthralgias.  All other systems reviewed and are negative.    Allergies  Codeine; Morphine and related; and Percocet  Home Medications    Current Outpatient Rx  Name  Route  Sig  Dispense  Refill  . ACETAMINOPHEN 325 MG PO TABS   Oral   Take 650 mg by mouth every 6 (six) hours as needed.         . AMPHETAMINE-DEXTROAMPHET ER 20 MG PO CP24   Oral   Take 20 mg by mouth every morning.         . CYCLOBENZAPRINE HCL 10 MG PO TABS   Oral   Take 10 mg by mouth 3 (three) times daily as needed.         Marland Kitchen DIAZEPAM 5 MG PO TABS   Oral   Take 5 mg by mouth every 6 (six) hours as needed.         . IBUPROFEN 200 MG PO TABS   Oral   Take 800 mg by mouth every 8 (eight) hours as needed.         Marland Kitchen NAPROXEN SODIUM 220 MG PO TABS   Oral   Take 220 mg by mouth 2 (two) times daily with a meal.         . TRAMADOL HCL 50 MG PO TABS   Oral   Take 50 mg by mouth every 6 (six) hours as needed.         . MECLIZINE HCL 12.5 MG PO TABS   Oral   Take  12.5 mg by mouth 3 (three) times daily as needed.         . TRIAMTERENE-HCTZ 75-50 MG PO TABS   Oral   Take 1 tablet by mouth as needed.           BP 124/64  Pulse 77  Temp 98.1 F (36.7 C) (Oral)  Resp 16  SpO2 100%  Physical Exam  Nursing note and vitals reviewed. Constitutional: She is oriented to person, place, and time. Vital signs are normal. She appears well-developed and well-nourished. She is active and cooperative.  HENT:  Head: Normocephalic.  Mouth/Throat: Oropharynx is clear and moist. No oropharyngeal exudate.  Eyes: Conjunctivae normal are normal. Pupils are equal, round, and reactive to light. No scleral icterus.  Neck: Trachea normal. Neck supple.  Cardiovascular: Normal rate and regular rhythm.   Pulmonary/Chest: Effort normal and breath sounds normal. She exhibits tenderness.    Abdominal: Soft. Bowel sounds are normal. There is no tenderness.  Neurological: She is alert and oriented to person, place, and time. No cranial nerve deficit or sensory deficit.  Skin: Skin is warm and dry.  Psychiatric: She has a normal mood and affect.  Her speech is normal and behavior is normal. Judgment and thought content normal. Cognition and memory are normal.    ED Course  Procedures (including critical care time)   Labs Reviewed  POCT URINALYSIS DIP (DEVICE)   Dg Ribs Unilateral W/chest Right  12/08/2011  *RADIOLOGY REPORT*  Clinical Data: Right flank pain, worse with movement since 12/04/2011.  No known injury.  No cough or cold symptoms.  RIGHT RIBS AND CHEST - 3+ VIEW  Comparison: None.  Findings: The cardiomediastinal silhouette is within normal limits. The lungs are free of focal consolidations and pleural effusions. Oblique views of the ribs show no acute, displaced rib fractures. Degenerative changes are seen in the spine.  No evidence for pneumothorax.  IMPRESSION:  1. No evidence for acute cardiopulmonary abnormality. 2.  No evidence for acute, displaced fracture.   Original Report Authenticated By: Norva Pavlov, M.D.      1. RUQ pain       MDM  Transfer to [facility] for further evaluation of right side pain with nausea and vomiting.        Johnsie Kindred, NP 12/08/11 1233

## 2011-12-08 NOTE — ED Provider Notes (Signed)
History     CSN: [id number]  Arrival date & time 12/08/11  1301   First MD Initiated Contact with Patient 12/08/11 1344      Chief Complaint  Patient presents with  . Flank Pain    (Consider location/radiation/quality/duration/timing/severity/associated sxs/prior treatment) Patient is a 51 y.o. female presenting with flank pain. The history is provided by the patient.  Flank Pain   patient here with right-sided chest pain worse with movement. No rashes appreciated. No dyspnea. Pain is slightly pleuritic. Denies any association with food. Went to urgent care Center prior to arrival here and had negative chest x-ray with blood test. No recent history of trauma. No prior history of same. Symptoms have been persistent for one week. No treatment used prior to arrival. Pain is characterized as sharp  Past Medical History  Diagnosis Date  . Fibromyalgia   . Osteoarthritis   . Osteoarthritis   . Vertigo   . Lichen planus     Past Surgical History  Procedure Date  . Hysteroscopy   . Arthroscopic knee   . Cesarean section     No family history on file.  History  Substance Use Topics  . Smoking status: Never Smoker   . Smokeless tobacco: Never Used  . Alcohol Use: No    OB History    Grav Para Term Preterm Abortions TAB SAB Ect Mult Living                  Review of Systems  Genitourinary: Positive for flank pain.  All other systems reviewed and are negative.    Allergies  Mobic; Codeine; Demerol; Fentanyl; Morphine and related; and Percocet  Home Medications   Current Outpatient Rx  Name  Route  Sig  Dispense  Refill  . ACETAMINOPHEN 325 MG PO TABS   Oral   Take 650 mg by mouth every 6 (six) hours as needed. For pain         . AMPHETAMINE-DEXTROAMPHET ER 20 MG PO CP24   Oral   Take 20 mg by mouth every morning.         . CYCLOBENZAPRINE HCL 10 MG PO TABS   Oral   Take 10 mg by mouth at bedtime as needed. For muscle spasms         . DIAZEPAM 5 MG  PO TABS   Oral   Take 5 mg by mouth every 6 (six) hours as needed. For anxiety         . IBUPROFEN 200 MG PO TABS   Oral   Take 800 mg by mouth every 8 (eight) hours as needed. For pain         . MECLIZINE HCL 12.5 MG PO TABS   Oral   Take 12.5 mg by mouth 3 (three) times daily as needed. dizziness         . NAPROXEN SODIUM 220 MG PO TABS   Oral   Take 220 mg by mouth 2 (two) times daily with a meal.         . TRIAMTERENE-HCTZ 75-50 MG PO TABS   Oral   Take 1 tablet by mouth daily as needed. For swelling         . TRAMADOL HCL 50 MG PO TABS   Oral   Take 50 mg by mouth every 6 (six) hours as needed. For pain           BP 129/87  Pulse 65  Temp 97.8 F (36.6 C) (  Oral)  Resp 22  SpO2 98%  Physical Exam  Nursing note and vitals reviewed. Constitutional: She is oriented to person, place, and time. She appears well-developed and well-nourished.  Non-toxic appearance. No distress.  HENT:  Head: Normocephalic and atraumatic.  Eyes: Conjunctivae normal, EOM and lids are normal. Pupils are equal, round, and reactive to light.  Neck: Normal range of motion. Neck supple. No tracheal deviation present. No mass present.  Cardiovascular: Normal rate, regular rhythm and normal heart sounds.  Exam reveals no gallop.   No murmur heard. Pulmonary/Chest: Effort normal and breath sounds normal. No stridor. No respiratory distress. She has no decreased breath sounds. She has no wheezes. She has no rhonchi. She has no rales. She exhibits tenderness. She exhibits no crepitus.    Abdominal: Soft. Normal appearance and bowel sounds are normal. She exhibits no distension. There is no tenderness. There is no rebound and no CVA tenderness.  Musculoskeletal: Normal range of motion. She exhibits no edema and no tenderness.  Neurological: She is alert and oriented to person, place, and time. She has normal strength. No cranial nerve deficit or sensory deficit. GCS eye subscore is 4. GCS  verbal subscore is 5. GCS motor subscore is 6.  Skin: Skin is warm and dry. No abrasion and no rash noted.  Psychiatric: She has a normal mood and affect. Her speech is normal and behavior is normal.    ED Course  Procedures (including critical care time)  Labs Reviewed  CBC WITH DIFFERENTIAL - Abnormal; Notable for the following:    MCH 25.5 (*)     RDW 15.9 (*)     All other components within normal limits  COMPREHENSIVE METABOLIC PANEL - Abnormal; Notable for the following:    Glucose, Bld 113 (*)     GFR calc non Af Amer 77 (*)     GFR calc Af Amer 89 (*)     All other components within normal limits  LIPASE, BLOOD   Dg Ribs Unilateral W/chest Right  12/08/2011  *RADIOLOGY REPORT*  Clinical Data: Right flank pain, worse with movement since 12/04/2011.  No known injury.  No cough or cold symptoms.  RIGHT RIBS AND CHEST - 3+ VIEW  Comparison: None.  Findings: The cardiomediastinal silhouette is within normal limits. The lungs are free of focal consolidations and pleural effusions. Oblique views of the ribs show no acute, displaced rib fractures. Degenerative changes are seen in the spine.  No evidence for pneumothorax.  IMPRESSION:  1. No evidence for acute cardiopulmonary abnormality. 2.  No evidence for acute, displaced fracture.   Original Report Authenticated By: Norva Pavlov, M.D.      No diagnosis found.    MDM  Pt to have chest ct to r/o pe, suspect muscle strain, work-up to be completed in cdu        Toy Baker, MD 12/08/11 1517

## 2011-12-08 NOTE — ED Notes (Signed)
Pt states she started having rt flank pain tues am , states she thought she was constipated so took medications all week , finally has bowel movement today put pain persist. States she also has some nausea on tues night. States she has had problems with numbness to rt wrist , radiating up arm. Symptoms are worse with activity and has been going on for 6 months.

## 2011-12-08 NOTE — ED Notes (Signed)
Pt presents to department from [facility] for evaluation of R sided flank pain. Ongoing since Tuesday. Describes pain as constant dull sensation, 10/10 at the time. Denies urinary symptoms. She is alert and oriented x4.

## 2011-12-08 NOTE — ED Provider Notes (Signed)
4:03 PM Patient has arrived in the CDU, after getting her CT.  Patient states that she is still having the right flank pain.  Otherwise, she does not have any complaints.  PE: Gen: A&O x4 HEENT: PERRL, EOM CHEST: RRR, no m/r/g ABD: BS x 4, ND/NT EXT: No edema, strong peripheral pulses NEURO: Sensation and strength intact bilaterally  Results for orders placed during the hospital encounter of 12/08/11  CBC WITH DIFFERENTIAL      Component Value Range   WBC 4.5  4.0 - 10.5 K/uL   RBC 4.87  3.87 - 5.11 MIL/uL   Hemoglobin 12.4  12.0 - 15.0 g/dL   HCT 16.1  09.6 - 04.5 %   MCV 80.1  78.0 - 100.0 fL   MCH 25.5 (*) 26.0 - 34.0 pg   MCHC 31.8  30.0 - 36.0 g/dL   RDW 40.9 (*) 81.1 - 91.4 %   Platelets 245  150 - 400 K/uL   Neutrophils Relative 51  43 - 77 %   Neutro Abs 2.3  1.7 - 7.7 K/uL   Lymphocytes Relative 41  12 - 46 %   Lymphs Abs 1.8  0.7 - 4.0 K/uL   Monocytes Relative 6  3 - 12 %   Monocytes Absolute 0.3  0.1 - 1.0 K/uL   Eosinophils Relative 2  0 - 5 %   Eosinophils Absolute 0.1  0.0 - 0.7 K/uL   Basophils Relative 0  0 - 1 %   Basophils Absolute 0.0  0.0 - 0.1 K/uL  COMPREHENSIVE METABOLIC PANEL      Component Value Range   Sodium 140  135 - 145 mEq/L   Potassium 4.7  3.5 - 5.1 mEq/L   Chloride 102  96 - 112 mEq/L   CO2 30  19 - 32 mEq/L   Glucose, Bld 113 (*) 70 - 99 mg/dL   BUN 12  6 - 23 mg/dL   Creatinine, Ser 7.82  0.50 - 1.10 mg/dL   Calcium 95.6  8.4 - 21.3 mg/dL   Total Protein 8.1  6.0 - 8.3 g/dL   Albumin 4.2  3.5 - 5.2 g/dL   AST 20  0 - 37 U/L   ALT 17  0 - 35 U/L   Alkaline Phosphatase 83  39 - 117 U/L   Total Bilirubin 0.3  0.3 - 1.2 mg/dL   GFR calc non Af Amer 77 (*) >90 mL/min   GFR calc Af Amer 89 (*) >90 mL/min  LIPASE, BLOOD      Component Value Range   Lipase 16  11 - 59 U/L   Dg Ribs Unilateral W/chest Right  12/08/2011  *RADIOLOGY REPORT*  Clinical Data: Right flank pain, worse with movement since 12/04/2011.  No known injury.  No  cough or cold symptoms.  RIGHT RIBS AND CHEST - 3+ VIEW  Comparison: None.  Findings: The cardiomediastinal silhouette is within normal limits. The lungs are free of focal consolidations and pleural effusions. Oblique views of the ribs show no acute, displaced rib fractures. Degenerative changes are seen in the spine.  No evidence for pneumothorax.  IMPRESSION:  1. No evidence for acute cardiopulmonary abnormality. 2.  No evidence for acute, displaced fracture.   Original Report Authenticated By: Norva Pavlov, M.D.    Ct Angio Chest Pe W/cm &/or Wo Cm  12/08/2011  *RADIOLOGY REPORT*  Clinical Data: Right flank pain extending up in the chest. Fibromyalgia.  CT ANGIOGRAPHY CHEST  Technique:  Multidetector CT  imaging of the chest using the standard protocol during bolus administration of intravenous contrast. Multiplanar reconstructed images including MIPs were obtained and reviewed to evaluate the vascular anatomy.  Contrast: OMNIPAQUE IOHEXOL 350 MG/ML SOLN  Comparison: 12/08/2011  Findings: Technical factors related to patient body habitus reduce diagnostic sensitivity and specificity.  No filling defect is identified in the pulmonary arterial tree to suggest pulmonary embolus.  No acute aortic abnormality observed.  No pericardial effusion or reversal of the normal interventricular septal contour.  Steatosis of the visualized portion of liver noted.  No pathologic thoracic adenopathy.  Lower thoracic spondylosis noted.  The lungs appear clear.  No acute bony findings observed.  No definite esophageal abnormality identified.  IMPRESSION:  1.  No embolus or specific abnormality as an attributable cause of the patient's right-sided flank pain and right chest pain. 2.  Steatosis of the visualized portion of the liver. 3.  Lower thoracic spondylosis.   Original Report Authenticated By: Gaylyn Rong, M.D.    Patient has a negative CT angiogram. I'm going to discharge the patient to home, and have her  followup with her primary care physician. Dr. Freida Busman tells me that he suspects that this is musculoskeletal pain, so I will give her ibuprofen and Robaxin. The patient is agreeable with this plan. She states that she will followup on November 14 with her doctor.   Roxy Horseman, PA-C 12/08/11 [phone]

## 2011-12-08 NOTE — ED Provider Notes (Signed)
Medical screening examination/treatment/procedure(s) were performed by resident physician or non-physician practitioner and as supervising physician I was immediately available for consultation/collaboration.   Devone Tousley DOUGLAS MD.    Iracema Lanagan D Arlon Bleier, MD 12/08/11 1328 

## 2011-12-09 NOTE — ED Provider Notes (Signed)
Medical screening examination/treatment/procedure(s) were conducted as a shared visit with non-physician practitioner(s) and myself.  I personally evaluated the patient during the encounter  Toy Baker, MD 12/09/11 [phone]

## 2011-12-17 ENCOUNTER — Other Ambulatory Visit: Payer: Self-pay | Admitting: Family Medicine

## 2011-12-17 ENCOUNTER — Other Ambulatory Visit: Payer: Medicare PPO

## 2011-12-17 DIAGNOSIS — R109 Unspecified abdominal pain: Secondary | ICD-10-CM

## 2011-12-17 DIAGNOSIS — R102 Pelvic and perineal pain: Secondary | ICD-10-CM

## 2011-12-20 ENCOUNTER — Ambulatory Visit
Admission: RE | Admit: 2011-12-20 | Discharge: 2011-12-20 | Disposition: A | Discharge status: Home / Self Care | Payer: Medicare PPO | Source: Ambulatory Visit | Attending: Family Medicine | Admitting: Family Medicine

## 2011-12-20 DIAGNOSIS — R109 Unspecified abdominal pain: Secondary | ICD-10-CM

## 2011-12-20 DIAGNOSIS — R102 Pelvic and perineal pain: Secondary | ICD-10-CM

## 2012-03-15 ENCOUNTER — Other Ambulatory Visit: Payer: Self-pay

## 2012-12-04 ENCOUNTER — Other Ambulatory Visit: Payer: Self-pay

## 2013-04-14 ENCOUNTER — Encounter: Payer: Medicare PPO | Attending: Family Medicine

## 2013-04-14 VITALS — Ht 63.0 in | Wt 264.7 lb

## 2013-04-14 DIAGNOSIS — Z713 Dietary counseling and surveillance: Secondary | ICD-10-CM | POA: Insufficient documentation

## 2013-04-14 DIAGNOSIS — E119 Type 2 diabetes mellitus without complications: Secondary | ICD-10-CM | POA: Insufficient documentation

## 2013-04-14 NOTE — Progress Notes (Signed)
Patient was seen on 04/14/13 for the first of a series of three diabetes self-management courses at the [facility].  Current HbA1c: 6.5%  The following learning objectives were met by the patient during this class:  Describe diabetes  State some common risk factors for diabetes  Defines the role of glucose and insulin  Identifies type of diabetes and pathophysiology  Describe the relationship between diabetes and cardiovascular risk  State the members of the Healthcare Team  States the rationale for glucose monitoring  State when to test glucose  State their individual Target Range  State the importance of logging glucose readings  Describe how to interpret glucose readings  Identifies A1C target  Explain the correlation between A1c and eAG values  State symptoms and treatment of high blood glucose  State symptoms and treatment of low blood glucose  Explain proper technique for glucose testing  Identifies proper sharps disposal  Handouts given during class include:  Living Well with Diabetes book  Carb Counting and Meal Planning book  Meal Plan Card  Carbohydrate guide  Meal planning worksheet  Low Sodium Flavoring Tips  The diabetes portion plate  C8Q to eAG Conversion Chart  Diabetes Medications  Diabetes Recommended Care Schedule  Support Group  Diabetes Success Plan  Core Class Satisfaction Survey  Follow-Up Plan:  Attend core 2

## 2013-04-21 DIAGNOSIS — E119 Type 2 diabetes mellitus without complications: Secondary | ICD-10-CM

## 2013-04-21 NOTE — Patient Instructions (Signed)
Goals:  Follow Diabetes Meal Plan as instructed  Eat 3 meals and 2 snacks, every 3-5 hrs  Limit carbohydrate intake to 30-45 grams carbohydrate/meal Limit carbohydrate intake to 0-15 grams carbohydrate/snack Add lean protein foods to meals/snacks  Monitor glucose levels as instructed by your doctor

## 2013-04-21 NOTE — Progress Notes (Signed)
Patient was seen on 04/21/2013 for the second of a series of three diabetes self-management courses at the [facility]. The following learning objectives were met by the patient during this class:   Describe the role of different macronutrients on glucose  Explain how carbohydrates affect blood glucose  State what foods contain the most carbohydrates  Demonstrate carbohydrate counting  Demonstrate how to read Nutrition Facts food label  Describe effects of various fats on heart health  Describe the importance of good nutrition for health and healthy eating strategies  Describe techniques for managing your shopping, cooking and meal planning  List strategies to follow meal plan when dining out  Describe the effects of alcohol on glucose and how to use it safely  Goals:  Follow Diabetes Meal Plan as instructed  Eat 3 meals and 2 snacks, every 3-5 hrs  Limit carbohydrate intake to 30-45 grams carbohydrate/meal Limit carbohydrate intake to 0-15 grams carbohydrate/snack Add lean protein foods to meals/snacks  Monitor glucose levels as instructed by your doctor    Follow-Up Plan:  Attend Core 3  Work towards following your personal food plan.

## 2013-04-28 DIAGNOSIS — E119 Type 2 diabetes mellitus without complications: Secondary | ICD-10-CM

## 2013-04-28 NOTE — Progress Notes (Signed)
Patient was seen on 04/28/13 for the third of a series of three diabetes self-management courses at the [facility]. The following learning objectives were met by the patient during this class:    State the amount of activity recommended for healthy living   Describe activities suitable for individual needs   Identify ways to regularly incorporate activity into daily life   Identify barriers to activity and ways to over come these barriers  Identify diabetes medications being personally used and their primary action for lowering glucose and possible side effects   Describe role of stress on blood glucose and develop strategies to address psychosocial issues   Identify diabetes complications and ways to prevent them  Explain how to manage diabetes during illness   Evaluate success in meeting personal goal   Establish 2-3 goals that they will plan to diligently work on until they return for the  4-month follow-up visit  Goals:  Follow Diabetes Meal Plan as instructed  Aim for 15-30 mins of physical activity daily as tolerated  Bring food record and glucose log to your follow up visit  Your patient has established the following 4 month goals in their individualized success plan: I will count my carb choices at most meals and snacks Reduce fat in my diet by eating less fats Be aware of snacking, loss weight I will increase my activity level at least 5 days a week If unable to go out of house, remember to move I will test my glucose at least 4 times a day, 7 days a week Evaluate what makes my blood sugar increase To help manage stress I will do exercise  Your patient has identified these potential barriers to change:  family  cooking  Eating out  Your patient has identified their diabetes self-care support plan as  [facility] Support Group  Attend classes if needed  Read  exercise   

## 2013-06-01 ENCOUNTER — Encounter (HOSPITAL_COMMUNITY): Payer: Self-pay | Admitting: Emergency Medicine

## 2013-06-01 ENCOUNTER — Emergency Department (HOSPITAL_COMMUNITY): Payer: No Typology Code available for payment source

## 2013-06-01 ENCOUNTER — Emergency Department (HOSPITAL_COMMUNITY)
Admission: EM | Admit: 2013-06-01 | Discharge: 2013-06-01 | Disposition: A | Discharge status: Home / Self Care | Payer: No Typology Code available for payment source | Attending: Emergency Medicine | Admitting: Emergency Medicine

## 2013-06-01 DIAGNOSIS — S139XXA Sprain of joints and ligaments of unspecified parts of neck, initial encounter: Secondary | ICD-10-CM | POA: Insufficient documentation

## 2013-06-01 DIAGNOSIS — Z791 Long term (current) use of non-steroidal anti-inflammatories (NSAID): Secondary | ICD-10-CM | POA: Insufficient documentation

## 2013-06-01 DIAGNOSIS — S46909A Unspecified injury of unspecified muscle, fascia and tendon at shoulder and upper arm level, unspecified arm, initial encounter: Secondary | ICD-10-CM | POA: Insufficient documentation

## 2013-06-01 DIAGNOSIS — S79919A Unspecified injury of unspecified hip, initial encounter: Secondary | ICD-10-CM | POA: Insufficient documentation

## 2013-06-01 DIAGNOSIS — S0990XA Unspecified injury of head, initial encounter: Secondary | ICD-10-CM | POA: Insufficient documentation

## 2013-06-01 DIAGNOSIS — M25552 Pain in left hip: Secondary | ICD-10-CM

## 2013-06-01 DIAGNOSIS — S161XXA Strain of muscle, fascia and tendon at neck level, initial encounter: Secondary | ICD-10-CM

## 2013-06-01 DIAGNOSIS — E669 Obesity, unspecified: Secondary | ICD-10-CM | POA: Insufficient documentation

## 2013-06-01 DIAGNOSIS — M1990 Unspecified osteoarthritis, unspecified site: Secondary | ICD-10-CM | POA: Insufficient documentation

## 2013-06-01 DIAGNOSIS — Y92410 Unspecified street and highway as the place of occurrence of the external cause: Secondary | ICD-10-CM | POA: Insufficient documentation

## 2013-06-01 DIAGNOSIS — Z872 Personal history of diseases of the skin and subcutaneous tissue: Secondary | ICD-10-CM | POA: Insufficient documentation

## 2013-06-01 DIAGNOSIS — S39012A Strain of muscle, fascia and tendon of lower back, initial encounter: Secondary | ICD-10-CM

## 2013-06-01 DIAGNOSIS — M25512 Pain in left shoulder: Secondary | ICD-10-CM

## 2013-06-01 DIAGNOSIS — S4980XA Other specified injuries of shoulder and upper arm, unspecified arm, initial encounter: Secondary | ICD-10-CM | POA: Insufficient documentation

## 2013-06-01 DIAGNOSIS — S335XXA Sprain of ligaments of lumbar spine, initial encounter: Secondary | ICD-10-CM | POA: Insufficient documentation

## 2013-06-01 DIAGNOSIS — Y9389 Activity, other specified: Secondary | ICD-10-CM | POA: Insufficient documentation

## 2013-06-01 DIAGNOSIS — S79929A Unspecified injury of unspecified thigh, initial encounter: Secondary | ICD-10-CM

## 2013-06-01 DIAGNOSIS — E119 Type 2 diabetes mellitus without complications: Secondary | ICD-10-CM | POA: Insufficient documentation

## 2013-06-01 MED ORDER — ACETAMINOPHEN 325 MG PO TABS
650.0000 mg | ORAL_TABLET | Freq: Once | ORAL | Status: DC
  Filled 2013-06-01: qty 2

## 2013-06-01 MED ORDER — TRAMADOL HCL 50 MG PO TABS
50.0000 mg | ORAL_TABLET | Freq: Once | ORAL | Status: AC
  Administered 2013-06-01: 50 mg via ORAL
  Filled 2013-06-01: qty 1
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
## 2013-06-01 NOTE — Discharge Instructions (Signed)
Rest, ice intermittently 15 minutes at a time for the next 24 hours followed by heat. Take your tramadol as directed as needed for pain. Avoid heavy lifting or hard physical activity for the next few days.  Cervical Sprain A cervical sprain is an injury in the neck in which the strong, fibrous tissues (ligaments) that connect your neck bones stretch or tear. Cervical sprains can range from mild to severe. Severe cervical sprains can cause the neck vertebrae to be unstable. This can lead to damage of the spinal cord and can result in serious nervous system problems. The amount of time it takes for a cervical sprain to get better depends on the cause and extent of the injury. Most cervical sprains heal in 1 to 3 weeks. CAUSES  Severe cervical sprains may be caused by:   Contact sport injuries (such as from football, rugby, wrestling, hockey, auto racing, gymnastics, diving, martial arts, or boxing).   Motor vehicle collisions.   Whiplash injuries. This is an injury from a sudden forward-and backward whipping movement of the head and neck.  Falls.  Mild cervical sprains may be caused by:   Being in an awkward position, such as while cradling a telephone between your ear and shoulder.   Sitting in a chair that does not offer proper support.   Working at a poorly Landscape [employer] station.   Looking up or down for long periods of time.  SYMPTOMS   Pain, soreness, stiffness, or a burning sensation in the front, back, or sides of the neck. This discomfort may develop immediately after the injury or slowly, 24 hours or more after the injury.   Pain or tenderness directly in the middle of the back of the neck.   Shoulder or upper back pain.   Limited ability to move the neck.   Headache.   Dizziness.   Weakness, numbness, or tingling in the hands or arms.   Muscle spasms.   Difficulty swallowing or chewing.   Tenderness and swelling of the neck.  DIAGNOSIS  Most  of the time your health care provider can diagnose a cervical sprain by taking your history and doing a physical exam. Your health care provider will ask about previous neck injuries and any known neck problems, such as arthritis in the neck. X-rays may be taken to find out if there are any other problems, such as with the bones of the neck. Other tests, such as a CT scan or MRI, may also be needed.  TREATMENT  Treatment depends on the severity of the cervical sprain. Mild sprains can be treated with rest, keeping the neck in place (immobilization), and pain medicines. Severe cervical sprains are immediately immobilized. Further treatment is done to help with pain, muscle spasms, and other symptoms and may include:  Medicines, such as pain relievers, numbing medicines, or muscle relaxants.   Physical therapy. This may involve stretching exercises, strengthening exercises, and posture training. Exercises and improved posture can help stabilize the neck, strengthen muscles, and help stop symptoms from returning.  HOME CARE INSTRUCTIONS   Put ice on the injured area.   Put ice in a plastic bag.   Place a towel between your skin and the bag.   Leave the ice on for 15 20 minutes, 3 4 times a day.   If your injury was severe, you may have been given a cervical collar to wear. A cervical collar is a two-piece collar designed to keep your neck from moving while it heals.  Do not remove the collar unless instructed by your health care provider.  If you have long hair, keep it outside of the collar.  Ask your health care provider before making any adjustments to your collar. Minor adjustments may be required over time to improve comfort and reduce pressure on your chin or on the back of your head.  Ifyou are allowed to remove the collar for cleaning or bathing, follow your health care provider's instructions on how to do so safely.  Keep your collar clean by wiping it with mild soap and water  and drying it completely. If the collar you have been given includes removable pads, remove them every 1 2 days and hand wash them with soap and water. Allow them to air dry. They should be completely dry before you wear them in the collar.  If you are allowed to remove the collar for cleaning and bathing, wash and dry the skin of your neck. Check your skin for irritation or sores. If you see any, tell your health care provider.  Do not drive while wearing the collar.   Only take over-the-counter or prescription medicines for pain, discomfort, or fever as directed by your health care provider.   Keep all follow-up appointments as directed by your health care provider.   Keep all physical therapy appointments as directed by your health care provider.   Make any needed adjustments to your workstation to promote good posture.   Avoid positions and activities that make your symptoms worse.   Warm up and stretch before being active to help prevent problems.  SEEK MEDICAL CARE IF:   Your pain is not controlled with medicine.   You are unable to decrease your pain medicine over time as planned.   Your activity level is not improving as expected.  SEEK IMMEDIATE MEDICAL CARE IF:   You develop any bleeding.  You develop stomach upset.  You have signs of an allergic reaction to your medicine.   Your symptoms get worse.   You develop new, unexplained symptoms.   You have numbness, tingling, weakness, or paralysis in any part of your body.  MAKE SURE YOU:   Understand these instructions.  Will watch your condition.  Will get help right away if you are not doing well or get worse. Document Released: 11/12/2006 Document Revised: 11/05/2012 Document Reviewed: 07/23/2012 [facility] Patient Information 2014 [place].  Lumbosacral Strain Lumbosacral strain is a strain of any of the parts that make up your lumbosacral vertebrae. Your lumbosacral vertebrae are the bones  that make up the lower third of your backbone. Your lumbosacral vertebrae are held together by muscles and tough, fibrous tissue (ligaments).  CAUSES  A sudden blow to your back can cause lumbosacral strain. Also, anything that causes an excessive stretch of the muscles in the low back can cause this strain. This is typically seen when people exert themselves strenuously, fall, lift heavy objects, bend, or crouch repeatedly. RISK FACTORS  Physically demanding work.  Participation in pushing or pulling sports or sports that require sudden twist of the back (tennis, golf, baseball).  Weight lifting.  Excessive lower back curvature.  Forward-tilted pelvis.  Weak back or abdominal muscles or both.  Tight hamstrings. SIGNS AND SYMPTOMS  Lumbosacral strain may cause pain in the area of your injury or pain that moves (radiates) down your leg.  DIAGNOSIS Your health care provider can often diagnose lumbosacral strain through a physical exam. In some cases, you may need tests such as  X-ray exams.  TREATMENT  Treatment for your lower back injury depends on many factors that your clinician will have to evaluate. However, most treatment will include the use of anti-inflammatory medicines. HOME CARE INSTRUCTIONS   Avoid hard physical activities (tennis, racquetball, waterskiing) if you are not in proper physical condition for it. This may aggravate or create problems.  If you have a back problem, avoid sports requiring sudden body movements. Swimming and walking are generally safer activities.  Maintain good posture.  Maintain a healthy weight.  For acute conditions, you may put ice on the injured area.  Put ice in a plastic bag.  Place a towel between your skin and the bag.  Leave the ice on for 20 minutes, 2 3 times a day.  When the low back starts healing, stretching and strengthening exercises may be recommended. SEEK MEDICAL CARE IF:  Your back pain is getting worse.  You  experience severe back pain not relieved with medicines. SEEK IMMEDIATE MEDICAL CARE IF:   You have numbness, tingling, weakness, or problems with the use of your arms or legs.  There is a change in bowel or bladder control.  You have increasing pain in any area of the body, including your belly (abdomen).  You notice shortness of breath, dizziness, or feel faint.  You feel sick to your stomach (nauseous), are throwing up (vomiting), or become sweaty.  You notice discoloration of your toes or legs, or your feet get very cold. MAKE SURE YOU:   Understand these instructions.  Will watch your condition.  Will get help right away if you are not doing well or get worse. Document Released: 10/25/2004 Document Revised: 11/05/2012 Document Reviewed: 09/03/2012 [facility] Patient Information 2014 [place], [place].  Motor Vehicle Collision  It is common to have multiple bruises and sore muscles after a motor vehicle collision (MVC). These tend to feel worse for the first 24 hours. You may have the most stiffness and soreness over the first several hours. You may also feel worse when you wake up the first morning after your collision. After this point, you will usually begin to improve with each day. The speed of improvement often depends on the severity of the collision, the number of injuries, and the location and nature of these injuries. HOME CARE INSTRUCTIONS   Put ice on the injured area.  Put ice in a plastic bag.  Place a towel between your skin and the bag.  Leave the ice on for 15-20 minutes, 03-04 times a day.  Drink enough fluids to keep your urine clear or pale yellow. Do not drink alcohol.  Take a warm shower or bath once or twice a day. This will increase blood flow to sore muscles.  You may return to activities as directed by your caregiver. Be careful when lifting, as this may aggravate neck or back pain.  Only take over-the-counter or prescription medicines for pain,  discomfort, or fever as directed by your caregiver. Do not use aspirin. This may increase bruising and bleeding. SEEK IMMEDIATE MEDICAL CARE IF:  You have numbness, tingling, or weakness in the arms or legs.  You develop severe headaches not relieved with medicine.  You have severe neck pain, especially tenderness in the middle of the back of your neck.  You have changes in bowel or bladder control.  There is increasing pain in any area of the body.  You have shortness of breath, lightheadedness, dizziness, or fainting.  You have chest pain.  You feel  sick to your stomach (nauseous), throw up (vomit), or sweat.  You have increasing abdominal discomfort.  There is blood in your urine, stool, or vomit.  You have pain in your shoulder (shoulder strap areas).  You feel your symptoms are getting worse. MAKE SURE YOU:   Understand these instructions.  Will watch your condition.  Will get help right away if you are not doing well or get worse. Document Released: 01/15/2005 Document Revised: 04/09/2011 Document Reviewed: 06/14/2010 [facility] Patient Information 2014 [place], [place].  Shoulder Pain The shoulder is the joint that connects your arms to your body. The bones that form the shoulder joint include the upper arm bone (humerus), the shoulder blade (scapula), and the collarbone (clavicle). The top of the humerus is shaped like a ball and fits into a rather flat socket on the scapula (glenoid cavity). A combination of muscles and strong, fibrous tissues that connect muscles to bones (tendons) support your shoulder joint and hold the ball in the socket. Small, fluid-filled sacs (bursae) are located in different areas of the joint. They act as cushions between the bones and the overlying soft tissues and help reduce friction between the gliding tendons and the bone as you move your arm. Your shoulder joint allows a wide range of motion in your arm. This range of motion allows you to do  things like scratch your back or throw a ball. However, this range of motion also makes your shoulder more prone to pain from overuse and injury. Causes of shoulder pain can originate from both injury and overuse and usually can be grouped in the following four categories:  Redness, swelling, and pain (inflammation) of the tendon (tendinitis) or the bursae (bursitis).  Instability, such as a dislocation of the joint.  Inflammation of the joint (arthritis).  Broken bone (fracture). HOME CARE INSTRUCTIONS   Apply ice to the sore area.  Put ice in a plastic bag.  Place a towel between your skin and the bag.  Leave the ice on for 15-20 minutes, 03-04 times per day for the first 2 days.  Stop using cold packs if they do not help with the pain.  If you have a shoulder sling or immobilizer, wear it as long as your caregiver instructs. Only remove it to shower or bathe. Move your arm as little as possible, but keep your hand moving to prevent swelling.  Squeeze a soft ball or foam pad as much as possible to help prevent swelling.  Only take over-the-counter or prescription medicines for pain, discomfort, or fever as directed by your caregiver. SEEK MEDICAL CARE IF:   Your shoulder pain increases, or new pain develops in your arm, hand, or fingers.  Your hand or fingers become cold and numb.  Your pain is not relieved with medicines. SEEK IMMEDIATE MEDICAL CARE IF:   Your arm, hand, or fingers are numb or tingling.  Your arm, hand, or fingers are significantly swollen or turn white or blue. MAKE SURE YOU:   Understand these instructions.  Will watch your condition.  Will get help right away if you are not doing well or get worse. Document Released: 10/25/2004 Document Revised: 10/10/2011 Document Reviewed: 12/30/2010 [facility] Patient Information 2014 [place].
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
## 2013-06-01 NOTE — ED Notes (Signed)
PT ambulated in hall unassisted. Tol well.
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
## 2013-08-31 ENCOUNTER — Encounter: Payer: Medicare PPO | Attending: Family Medicine

## 2013-08-31 DIAGNOSIS — E119 Type 2 diabetes mellitus without complications: Secondary | ICD-10-CM | POA: Diagnosis not present

## 2013-08-31 DIAGNOSIS — Z713 Dietary counseling and surveillance: Secondary | ICD-10-CM | POA: Insufficient documentation

## 2013-08-31 NOTE — Progress Notes (Signed)
Patient was seen on 08/31/2013 for a review of the series of three diabetes self-management courses at the [facility]. The following learning objectives were met by the patient during this class:    Reviewed blood glucose monitoring and interpretation including the recommended target ranges and Hgb A1c.    Reviewed on carb counting, importance of regularly scheduled meals/snacks, and meal planning.    Reviewed the effects of physical activity on glucose levels and long-term glucose control.  Recommended goal of 150 minutes of physical activity/week.   Reviewed patient medications and discussed role of medication on blood glucose and possible side effects.   Discussed strategies to manage stress, psychosocial issues, and other obstacles to diabetes management.   Encouraged moderate weight reduction to improve glucose levels.     Reviewed short-term complications: hyper- and hypo-glycemia.  Discussed causes, symptoms, and treatment options.   Reviewed prevention, detection, and treatment of long-term complications.  Discussed the role of prolonged elevated glucose levels on body systems.  Goals:  Follow Diabetes Meal Plan as instructed  Eat 3 meals and 2 snacks, every 3-5 hrs  Limit carbohydrate intake to 45 grams carbohydrate/meal Limit carbohydrate intake to 15 grams carbohydrate/snack Add lean protein foods to meals/snacks  Monitor glucose levels as instructed by your doctor  Aim for goal of 15-30 mins of physical activity daily as tolerated  Bring food record and glucose log to your next nutrition visit   

## 2013-08-31 NOTE — Patient Instructions (Signed)
Goals:  Follow Diabetes Meal Plan as instructed  Eat 3 meals and 2 snacks, every 3-5 hrs  Limit carbohydrate intake to 45 grams carbohydrate/meal Limit carbohydrate intake to 15 grams carbohydrate/snack Add lean protein foods to meals/snacks  Monitor glucose levels as instructed by your doctor  Aim for goal of 15-30 mins of physical activity daily as tolerated  Bring food record and glucose log to your next nutrition visit 

## 2014-07-26 ENCOUNTER — Other Ambulatory Visit: Payer: Self-pay

## 2015-01-04 ENCOUNTER — Ambulatory Visit
Admission: RE | Admit: 2015-01-04 | Discharge: 2015-01-04 | Disposition: A | Discharge status: Home / Self Care | Payer: Medicare PPO | Source: Ambulatory Visit | Attending: Family Medicine | Admitting: Family Medicine

## 2015-01-04 ENCOUNTER — Other Ambulatory Visit: Payer: Self-pay | Admitting: Family Medicine

## 2015-01-04 DIAGNOSIS — M25552 Pain in left hip: Secondary | ICD-10-CM

## 2016-01-05 ENCOUNTER — Telehealth: Payer: Self-pay | Admitting: *Deleted

## 2016-01-05 NOTE — Telephone Encounter (Signed)
NOTES SENT TO SCHEDULING ON 01/05/16.

## 2016-02-19 DIAGNOSIS — E118 Type 2 diabetes mellitus with unspecified complications: Secondary | ICD-10-CM | POA: Insufficient documentation

## 2016-02-19 DIAGNOSIS — R079 Chest pain, unspecified: Secondary | ICD-10-CM | POA: Insufficient documentation

## 2016-02-20 ENCOUNTER — Encounter: Payer: Self-pay | Admitting: Interventional Cardiology

## 2016-02-20 ENCOUNTER — Ambulatory Visit (INDEPENDENT_AMBULATORY_CARE_PROVIDER_SITE_OTHER): Payer: Medicare Other | Admitting: Interventional Cardiology

## 2016-02-20 VITALS — BP 152/90 | HR 69 | Ht 63.0 in | Wt 241.0 lb

## 2016-02-20 DIAGNOSIS — E118 Type 2 diabetes mellitus with unspecified complications: Secondary | ICD-10-CM

## 2016-02-20 DIAGNOSIS — I10 Essential (primary) hypertension: Secondary | ICD-10-CM | POA: Diagnosis not present

## 2016-02-20 DIAGNOSIS — R079 Chest pain, unspecified: Secondary | ICD-10-CM

## 2016-02-20 NOTE — Patient Instructions (Signed)
Medication Instructions:  None  Labwork: None  Testing/Procedures: Your physician has requested that you have an exercise tolerance test. For further information please visit [email or web address]. Please also follow instruction sheet, as given.   Follow-Up: Your physician recommends that you schedule a follow-up appointment as needed with Dr. Smith.    Any Other Special Instructions Will Be Listed Below (If Applicable).     If you need a refill on your cardiac medications before your next appointment, please call your pharmacy.   

## 2016-02-20 NOTE — Progress Notes (Signed)
Cardiology Office Note    Date:  02/20/2016   ID:  Jessica [name], DOB 05-Aug-1960, MRN [id number]  PCP:  Shirline Frees, MD  Cardiologist: Sinclair Grooms, MD   Chief Complaint  Patient presents with  . Shortness of Breath  . Chest Pain    History of Present Illness:  Jessica [name] is a 56 y.o. female referred by Dr. Nancy Fetter for evaluation of chest discomfort and dyspnea.  Jessica [name] is a 56 year old African-American female with a 8-9 month history of recurring left pectoral and subclavicular region chest discomfort. She cannot give a qualitative description of the discomfort. The discomfort can last hours. It is not precipitated by physical activity and there are no ameliorating factors. The discomfort is not made worse by lying. There is associated dyspnea on exertion. The discomfort can be present and is not made worse by going to the gym and exercising.  There is a family history of in stage kidney disease, hypertension, CVA, and hypertension related heart failure.    Past Medical History:  Diagnosis Date  . Diabetes mellitus without complication ([place])   . Fibromyalgia   . Lichen planus   . Osteoarthritis   . Osteoarthritis   . Vertigo     Past Surgical History:  Procedure Laterality Date  . arthroscopic knee    . CESAREAN SECTION    . HYSTEROSCOPY      Current Medications: Outpatient Medications Prior to Visit  Medication Sig Dispense Refill  . Chromium 500 MCG TABS Take 500 mcg by mouth daily.    . cyclobenzaprine (FLEXERIL) 10 MG tablet Take 10 mg by mouth at bedtime as needed. For muscle spasms    . diazepam (VALIUM) 5 MG tablet Take 5 mg by mouth every 6 (six) hours as needed. For anxiety    . diphenhydrAMINE (BENADRYL) 25 MG tablet Take 25 mg by mouth every 6 (six) hours as needed for allergies.    Marland Kitchen MAGNESIUM PO Take 3 tablets by mouth daily.    . meclizine (ANTIVERT) 12.5 MG tablet Take 12.5 mg by mouth 3 (three) times daily as needed. dizziness    .  PRESCRIPTION MEDICATION Inject 1 Syringe into the muscle 3 (three) times a week. METHYLCOBALAMIN    . traMADol (ULTRAM) 50 MG tablet Take 50 mg by mouth every 6 (six) hours as needed. For pain    . triamterene-hydrochlorothiazide (MAXZIDE) 75-50 MG per tablet Take 1 tablet by mouth daily as needed. For swelling    . Cholecalciferol 2000 UNITS CAPS Take 2,000 Units by mouth daily.    Marland Kitchen selenium 50 MCG TABS tablet Take 100 mcg by mouth daily.     No facility-administered medications prior to visit.      Allergies:   Mobic [meloxicam]; Codeine; Demerol [meperidine]; Fentanyl; Morphine and related; and Percocet [oxycodone-acetaminophen]   Social History   Social History  . Marital status: Married    Spouse name: N/A  . Number of children: N/A  . Years of education: N/A   Social History Main Topics  . Smoking status: Never Smoker  . Smokeless tobacco: Never Used  . Alcohol use No  . Drug use: No  . Sexual activity: Not Asked   Other Topics Concern  . None   Social History Narrative  . None     Family History:  The patient's Father had hypertension, hypertension related congestive heart failure, and refused transplantation. Mother had CVA, end-stage kidney disease, hypertension, and diabetes. One sister has hypertension.  ROS:   Please see the history of present illness.  chronic back pain, muscle discomfort, rash, dizziness, easy bruising, abdominal pain, headaches, joint swelling, difficulty with balance, leg pain, and excessive fatigue.other systems reviewed and are negative.   PHYSICAL EXAM:   VS:  BP (!) 152/90 (BP Location: Right Arm)   Pulse 69   Ht 5\' 3"  (1.6 m)   Wt 241 lb (109.3 kg)   BMI 42.69 kg/m    GEN: Well nourished, well developed, in no acute distress  HEENT: normal  Neck: no JVD, carotid bruits, or masses Cardiac: RRR; no murmurs, rubs, or gallops,no edema  Respiratory:  clear to auscultation bilaterally, normal work of breathing GI: soft, nontender,  nondistended, + BS MS: no deformity or atrophy  Skin: warm and dry, no rash Neuro:  Alert and Oriented x 3, Strength and sensation are intact Psych: euthymic mood, full affect  Wt Readings from Last 3 Encounters:  02/20/16 241 lb (109.3 kg)  06/01/13 250 lb (113.4 kg)  04/14/13 264 lb 11.2 oz (120.1 kg)      Studies/Labs Reviewed:   EKG:  EKG  Normal sinus rhythm. Left atrial abnormality.  Recent Labs: No results found for requested labs within last 8760 hours.   Lipid Panel No results found for: CHOL, TRIG, HDL, CHOLHDL, VLDL, LDLCALC, LDLDIRECT  Additional studies/ records that were reviewed today include: No functional studies exist.     ASSESSMENT:    1. Chest pain at rest   2. Essential hypertension   3. Controlled type 2 diabetes mellitus with complication, without long-term current use of insulin ([place])   4. Morbid obesity ([place])      PLAN:  In order of problems listed above:  1. Exercise treadmill test. 2. Discussed blood pressure targets, 140/90 mmHg a less. 3. Low carbohydrate diet. 4. Encouraged physical activity, weight loss, and decreased caloric intake.    Medication Adjustments/Labs and Tests Ordered: Current medicines are reviewed at length with the patient today.  Concerns regarding medicines are outlined above.  Medication changes, Labs and Tests ordered today are listed in the Patient Instructions below. Patient Instructions  Medication Instructions:  None  Labwork: None  Testing/Procedures: Your physician has requested that you have an exercise tolerance test. For further information please visit [email or web address]. Please also follow instruction sheet, as given.   Follow-Up: Your physician recommends that you schedule a follow-up appointment as needed with Dr. Tamala Julian.   Any Other Special Instructions Will Be Listed Below (If Applicable).     If you need a refill on your cardiac medications before your next appointment, please call  your pharmacy.      Signed, Sinclair Grooms, MD  02/20/2016 9:52 AM    [facility] [address], [place], [place]  [address] Phone: [phone]; Fax: [phone]

## 2016-02-22 ENCOUNTER — Encounter: Payer: Self-pay | Admitting: Interventional Cardiology

## 2016-02-22 ENCOUNTER — Ambulatory Visit (INDEPENDENT_AMBULATORY_CARE_PROVIDER_SITE_OTHER): Payer: Medicare Other

## 2016-02-22 DIAGNOSIS — I10 Essential (primary) hypertension: Secondary | ICD-10-CM

## 2016-02-22 DIAGNOSIS — R079 Chest pain, unspecified: Secondary | ICD-10-CM | POA: Diagnosis not present

## 2016-02-22 LAB — EXERCISE TOLERANCE TEST
CSEPED: 7 min
CSEPPHR: 160 {beats}/min
Estimated workload: 8.5 METS
Exercise duration (sec): 0 s
MPHR: 165 {beats}/min
Percent HR: 96 %
RPE: 17
Rest HR: 86 {beats}/min

## 2016-02-23 ENCOUNTER — Telehealth: Payer: Self-pay | Admitting: Interventional Cardiology

## 2016-02-23 ENCOUNTER — Other Ambulatory Visit: Payer: Self-pay | Admitting: *Deleted

## 2016-02-23 DIAGNOSIS — I10 Essential (primary) hypertension: Secondary | ICD-10-CM

## 2016-02-23 MED ORDER — TRIAMTERENE-HCTZ 75-50 MG PO TABS: 1.0000 | ORAL_TABLET | ORAL | 1 refills | Status: DC

## 2016-02-23 NOTE — Telephone Encounter (Signed)
New Message    Dr Tamala Julian said to take meds 3 times a week when she is exercising, but if she isnt exercising does she still need to take it.    And can she take it at night time because it makes her sleepy   This is the medication she has questions on triamterene-hydrochlorothiazide (MAXZIDE) 75-50 MG tablet Take 1 tablet by mouth 3 (three) times a week. For swelling

## 2016-02-23 NOTE — Telephone Encounter (Signed)
The recommendation had nothing to do with exercise. She needs to take it three times weekly. Preferably every other day. Better to take diuretic early in the day to avoid nocturia.

## 2016-02-24 NOTE — Telephone Encounter (Signed)
Spoke with Dr. Tamala Julian and he would like for pt to have BMET in 2 weeks as well.  Scheduled labs for 03/09/16. Advised pt of information provided by Dr. Tamala Julian.  Pt concerned about being on medication.  She felt if it was only d/t BP being elevated during exercise that it may not be necessary.  Pt concerned about medication making her fatigued as well since she is already fatigued d/t fibromyalgia.  Advised pt that the increased fatigue should subside after she has been on the medication for a little bit.  Also advised pt to monitor BP and call our office if it is consistently running low.  Pt verbalized understanding and was in agreement with this plan.

## 2016-03-09 ENCOUNTER — Other Ambulatory Visit: Payer: Medicare Other | Admitting: *Deleted

## 2016-03-09 DIAGNOSIS — I10 Essential (primary) hypertension: Secondary | ICD-10-CM

## 2016-03-09 LAB — BASIC METABOLIC PANEL
BUN/Creatinine Ratio: 31 — ABNORMAL HIGH (ref 9–23)
BUN: 27 mg/dL — ABNORMAL HIGH (ref 6–24)
CO2: 25 mmol/L (ref 18–29)
CREATININE: 0.88 mg/dL (ref 0.57–1.00)
Calcium: 10.1 mg/dL (ref 8.7–10.2)
Chloride: 96 mmol/L (ref 96–106)
GFR calc Af Amer: 86 mL/min/{1.73_m2} (ref >59)
GFR, EST NON AFRICAN AMERICAN: 74 mL/min/{1.73_m2} (ref >59)
Glucose: 100 mg/dL — ABNORMAL HIGH (ref 65–99)
Potassium: 4.5 mmol/L (ref 3.5–5.2)
Sodium: 140 mmol/L (ref 134–144)

## 2016-08-21 ENCOUNTER — Other Ambulatory Visit: Payer: Self-pay | Admitting: Gastroenterology

## 2016-08-21 DIAGNOSIS — R1084 Generalized abdominal pain: Secondary | ICD-10-CM

## 2016-08-29 ENCOUNTER — Other Ambulatory Visit: Payer: Medicare Other

## 2016-09-05 ENCOUNTER — Ambulatory Visit
Admission: RE | Admit: 2016-09-05 | Discharge: 2016-09-05 | Disposition: A | Discharge status: Home / Self Care | Payer: Medicare Other | Source: Ambulatory Visit | Attending: Gastroenterology | Admitting: Gastroenterology

## 2016-09-05 DIAGNOSIS — R1084 Generalized abdominal pain: Secondary | ICD-10-CM

## 2016-10-23 ENCOUNTER — Other Ambulatory Visit: Payer: Self-pay | Admitting: General Surgery

## 2017-01-10 ENCOUNTER — Ambulatory Visit (INDEPENDENT_AMBULATORY_CARE_PROVIDER_SITE_OTHER): Payer: Medicare Other

## 2017-01-10 ENCOUNTER — Ambulatory Visit: Payer: Medicare Other | Admitting: Podiatry

## 2017-01-10 ENCOUNTER — Encounter: Payer: Self-pay | Admitting: Podiatry

## 2017-01-10 DIAGNOSIS — M19071 Primary osteoarthritis, right ankle and foot: Secondary | ICD-10-CM | POA: Diagnosis not present

## 2017-01-10 DIAGNOSIS — M779 Enthesopathy, unspecified: Secondary | ICD-10-CM | POA: Diagnosis not present

## 2017-01-10 DIAGNOSIS — M659 Synovitis and tenosynovitis, unspecified: Secondary | ICD-10-CM | POA: Diagnosis not present

## 2017-01-10 DIAGNOSIS — E119 Type 2 diabetes mellitus without complications: Secondary | ICD-10-CM | POA: Diagnosis not present

## 2017-01-10 DIAGNOSIS — M19072 Primary osteoarthritis, left ankle and foot: Secondary | ICD-10-CM

## 2017-01-10 MED ORDER — METHYLPREDNISOLONE 4 MG PO TBPK: ORAL_TABLET | ORAL | 0 refills | Status: DC

## 2017-01-10 NOTE — Progress Notes (Signed)
Subjective:    Patient ID: Jessica [name], female    DOB: November 19, 1960, 56 y.o.   MRN: [id number]  HPI Ms. Wages presents the office today for concerns of bilateral foot pain the left side worse than the right.  She states she gets pain in the top of her feet and also on the right side history of pain to the outside aspect of her foot.  She states that this is been ongoing for a "little while" it started 2 years ago when she was paddle boating.  She had some pain to the area but this did dissipate but over the last couple months she started to notice recurrence of the pain.  She denies any recent injury or trauma to the area.  She states it hurts worse after activity or the end of the day.  She states in the morning when she first gets up she has put shoes on but does not wake her up.  She denies any numbness or tingling.  Last A1c was 6.  Denies any claudication symptoms.  She has no other concerns today.   Review of Systems  All other systems reviewed and are negative.  Past Medical History:  Diagnosis Date  . Diabetes mellitus without complication ([place])   . Fibromyalgia   . Lichen planus   . Osteoarthritis   . Osteoarthritis   . Vertigo     Past Surgical History:  Procedure Laterality Date  . arthroscopic knee    . CESAREAN SECTION    . HYSTEROSCOPY       Current Outpatient Medications:  .  acetaminophen (TYLENOL) 500 MG tablet, Take 1,000 mg by mouth every 6 (six) hours as needed for mild pain., Disp: , Rfl:  .  Cholecalciferol (VITAMIN D3) 5000 units CAPS, Take 5,000 Units by mouth daily., Disp: , Rfl:  .  Chromium 500 MCG TABS, Take 500 mcg by mouth daily., Disp: , Rfl:  .  cyclobenzaprine (FLEXERIL) 10 MG tablet, Take 10 mg by mouth at bedtime as needed. For muscle spasms, Disp: , Rfl:  .  diazepam (VALIUM) 5 MG tablet, Take 5 mg by mouth every 6 (six) hours as needed. For anxiety, Disp: , Rfl:  .  diphenhydrAMINE (BENADRYL) 25 MG tablet, Take 25 mg by mouth every 6 (six)  hours as needed for allergies., Disp: , Rfl:  .  MAGNESIUM PO, Take 3 tablets by mouth daily., Disp: , Rfl:  .  meclizine (ANTIVERT) 12.5 MG tablet, Take 12.5 mg by mouth 3 (three) times daily as needed. dizziness, Disp: , Rfl:  .  methylPREDNISolone (MEDROL DOSEPAK) 4 MG TBPK tablet, Take as directed, Disp: 21 tablet, Rfl: 0 .  PRESCRIPTION MEDICATION, Inject 1 Syringe into the muscle 3 (three) times a week. METHYLCOBALAMIN, Disp: , Rfl:  .  traMADol (ULTRAM) 50 MG tablet, Take 50 mg by mouth every 6 (six) hours as needed. For pain, Disp: , Rfl:  .  triamterene-hydrochlorothiazide (MAXZIDE) 75-50 MG tablet, Take 1 tablet by mouth 3 (three) times a week. For swelling, Disp: 12 tablet, Rfl: 1  Allergies  Allergen Reactions  . Mobic [Meloxicam] Shortness Of Breath  . Codeine     Unknown  . Demerol [Meperidine]     Unknown  . Fentanyl     Unknown  . Morphine And Related     Unknown  . Percocet [Oxycodone-Acetaminophen]     Unknown    Social History   Socioeconomic History  . Marital status: Married  Spouse name: Not on file  . Number of children: Not on file  . Years of education: Not on file  . Highest education level: Not on file  Social Needs  . Financial resource strain: Not on file  . Food insecurity - worry: Not on file  . Food insecurity - inability: Not on file  . Transportation needs - medical: Not on file  . Transportation needs - non-medical: Not on file  Occupational History  . Not on file  Tobacco Use  . Smoking status: Never Smoker  . Smokeless tobacco: Never Used  Substance and Sexual Activity  . Alcohol use: No  . Drug use: No  . Sexual activity: Not on file  Other Topics Concern  . Not on file  Social History Narrative  . Not on file        Objective:   Physical Exam General: AAO x3, NAD  Dermatological: Skin is warm, dry and supple bilateral. Nails x 10 are well manicured; remaining integument appears unremarkable at this time. There are no  open sores, no preulcerative lesions, no rash or signs of infection present.  Vascular: Dorsalis Pedis artery and Posterior Tibial artery pedal pulses are 2/4 bilateral with immedate capillary fill time.There is no pain with calf compression, swelling, warmth, erythema.   Neruologic: Grossly intact via light touch bilateral. Protective threshold with Semmes Wienstein monofilament intact to all pedal sites bilateral.   Musculoskeletal: The dorsal aspect of the foot, Lisfranc, midfoot joint there is a probable small bone spur present.  There is tenderness palpation along the Lisfranc joint there is no specific area pinpoint tenderness or pain to vibratory sensation.  Ankle, subtalar joint range of motion intact.  Achilles tendon, plantar fascia intact.  On the right side there is also some mild discomfort in the same area but there is minimal bone spur formation.  On the right side there is mild tenderness in the course of the peroneal tendon just inferior to the lateral malleolus along the insertion into the fifth metatarsal base.  The peroneal tendon appears to be intact.  Achilles tendon, plantar fascia intact.  No other areas of tenderness identified bilaterally.  Equinus is present.  Muscular strength 5/5 in all groups tested bilateral.  Gait: Unassisted, Nonantalgic.      Assessment & Plan:  56 year old female with bilateral foot pain left side worse than right -Treatment options discussed including all alternatives, risks, and complications -Etiology of symptoms were discussed -X-rays were obtained and reviewed with the patient. Small bone spur present the dorsal Lisfranc joint.  Heel spurs present. -Medrol dose pack -Discussed stretching, rehab exercises and work she was provided for peroneal tendon rehab. -Ice to the area -I discussed shoe gear modifications and orthotics.  I will have her follow-up with a Rick for inserts.  -We will see her back after the inserts or sooner if any issues  are to arise or any changes.  She agrees this plan she has no further questions today.  Trula Slade DPM

## 2017-01-10 NOTE — Patient Instructions (Signed)
Peroneal Tendinopathy Rehab  Ask your health care provider which exercises are safe for you. Do exercises exactly as told by your health care provider and adjust them as directed. It is normal to feel mild stretching, pulling, tightness, or discomfort as you do these exercises, but you should stop right away if you feel sudden pain or your pain gets worse.Do not begin these exercises until told by your health care provider.  Stretching and range of motion exercises  These exercises warm up your muscles and joints and improve the movement and flexibility of your ankle. These exercises also help to relieve pain and stiffness.  Exercise A: Gastroc and soleus, standing  1. Stand on the edge of a step on the balls of your feet. The ball of your foot is on the walking surface, right under your toes.  2. Hold onto the railing for balance.  3. Slowly lift your left / right foot, allowing your body weight to press your left / right heel down over the edge of the step. You should feel a stretch in your left / right calf.  4. Hold this position for __________ seconds.  Repeat __________ times with your left / right knee straight and __________ times with your left / right knee bent. Complete this stretch __________ times per day.  Strengthening exercises  These exercises improve the strength and endurance of your foot and ankle. Endurance is the ability to use your muscles for a long time, even after they get tired.  Exercise B: Dorsiflexors    1. Secure a rubber exercise band or tube to an object, like a table leg, that will not move if it is pulled on.  2. Secure the other end of the band around your left / right foot.  3. Sit on the floor, facing the object with your left / right foot extended. The band or tube should be slightly tense when your foot is relaxed.  4. Slowly flex your left / right ankle and toes to bring your foot toward you.  5. Hold this position for __________ seconds.  6. Slowly return your foot to the  starting position.  Repeat __________ times. Complete this exercise __________ times per day.  Exercise C: Evertors  1. Sit on the floor with your legs straight out in front of you.  2. Loop a rubber exercise or band or tube around the ball of your left / right foot. The ball of your foot is on the walking surface, right under your toes.  3. Hold the ends of the band in your hands, or secure the band to a stable object.  4. Slowly push your foot outward, away from your other leg.  5. Hold this position for __________ seconds.  6. Slowly return your foot to the starting position.  Repeat __________ times. Complete this exercise __________ times per day.  Exercise D: Standing heel raise (  plantar flexion)  1. Stand with your feet shoulder-width apart with the balls of your feet on a step. The ball of your foot is on the walking surface, right under your toes.  2. Keep your weight spread evenly over the width of your feet while you rise up on your toes. Use a wall or railing to steady yourself, but try not to use it for support.  3. If this exercise is too easy, try these options:  ? Shift your weight toward your left / right leg until you feel challenged.  ? If told by   your health care provider, stand on your left / right leg only.  4. Hold this position for __________ seconds.  Repeat __________ times. Complete this exercise __________ times per day.  Exercise E: Single leg stand  1. Without shoes, stand near a railing or in a doorway. You may hold onto the railing or door frame as needed.  2. Stand on your left / right foot. Keep your big toe down on the floor and try to keep your arch lifted.  ? Do not roll to the outside of your foot.  ? If this exercise is too easy, you can try it with your eyes closed or while standing on a pillow.  3. Hold this position for __________ seconds.  Repeat __________ times. Complete this exercise __________ times per day.  This information is not intended to replace advice given to  you by your health care provider. Make sure you discuss any questions you have with your health care provider.  Document Released: 01/15/2005 Document Revised: 09/22/2015 Document Reviewed: 12/04/2014  Elsevier Interactive Patient Education  2018 [employer]

## 2017-01-24 ENCOUNTER — Ambulatory Visit: Payer: Medicare Other | Admitting: Orthotics

## 2017-01-24 DIAGNOSIS — M779 Enthesopathy, unspecified: Secondary | ICD-10-CM

## 2017-01-24 NOTE — Progress Notes (Signed)
Patient came into today to be cast for [facility]. Upon recommendation of Dr. Jacqualyn Posey Patient presents with bilateral midfoot tendonitis and dorsum pain (medial) Goals are arch support, rear foot mid foot stability  Plan vendor Richy  Patient wants insurance verification before charges dropped.

## 2017-02-14 ENCOUNTER — Other Ambulatory Visit: Payer: Medicare Other | Admitting: Orthotics

## 2017-11-11 ENCOUNTER — Other Ambulatory Visit: Payer: Self-pay | Admitting: Physician Assistant

## 2017-11-11 DIAGNOSIS — R1011 Right upper quadrant pain: Secondary | ICD-10-CM

## 2017-11-11 DIAGNOSIS — R1031 Right lower quadrant pain: Secondary | ICD-10-CM

## 2017-11-12 ENCOUNTER — Ambulatory Visit
Admission: RE | Admit: 2017-11-12 | Discharge: 2017-11-12 | Disposition: A | Discharge status: Home / Self Care | Payer: Medicare Other | Source: Ambulatory Visit | Attending: Physician Assistant | Admitting: Physician Assistant

## 2017-11-12 DIAGNOSIS — R1031 Right lower quadrant pain: Secondary | ICD-10-CM

## 2017-11-12 DIAGNOSIS — R1011 Right upper quadrant pain: Secondary | ICD-10-CM

## 2017-11-12 MED ORDER — IOPAMIDOL (ISOVUE-300) INJECTION 61%
100.0000 mL | Freq: Once | INTRAVENOUS | Status: AC | PRN
  Administered 2017-11-12: 100 mL via INTRAVENOUS

## 2018-11-05 ENCOUNTER — Other Ambulatory Visit: Payer: Self-pay | Admitting: Podiatry

## 2018-11-05 ENCOUNTER — Other Ambulatory Visit: Payer: Self-pay

## 2018-11-05 ENCOUNTER — Ambulatory Visit (INDEPENDENT_AMBULATORY_CARE_PROVIDER_SITE_OTHER): Payer: Medicare Other

## 2018-11-05 ENCOUNTER — Encounter: Payer: Self-pay | Admitting: Podiatry

## 2018-11-05 ENCOUNTER — Ambulatory Visit: Payer: Medicare Other | Admitting: Podiatry

## 2018-11-05 DIAGNOSIS — S93491A Sprain of other ligament of right ankle, initial encounter: Secondary | ICD-10-CM

## 2018-11-05 DIAGNOSIS — M79671 Pain in right foot: Secondary | ICD-10-CM | POA: Diagnosis not present

## 2018-11-05 DIAGNOSIS — S93401A Sprain of unspecified ligament of right ankle, initial encounter: Secondary | ICD-10-CM

## 2018-11-05 DIAGNOSIS — S93402A Sprain of unspecified ligament of left ankle, initial encounter: Secondary | ICD-10-CM

## 2018-11-05 DIAGNOSIS — M19072 Primary osteoarthritis, left ankle and foot: Secondary | ICD-10-CM

## 2018-11-05 DIAGNOSIS — M779 Enthesopathy, unspecified: Secondary | ICD-10-CM

## 2018-11-05 DIAGNOSIS — M79672 Pain in left foot: Secondary | ICD-10-CM

## 2018-11-05 NOTE — Progress Notes (Signed)
Subjective:  Patient ID: Jessica [name], female    DOB: Feb 02, 1960,  MRN: [id number]  Chief Complaint  Patient presents with  . Foot Pain    pt is here for right anklr pain, as well as left foot and ankle pain, pt states that she fell off her stairs a couple of weeks ago, pt is concerned with the continuous swelling    58 y.o. female presents with the above complaint.  She states she was climbing down the stairs and twisted and felt.  She states that she sprained her right ankle and while getting up may have sprained her left ankle as well.  Incident happened about 2 to 4 weeks ago.  She states that it has been gradually hurting and increasing in intensity of the pain as she ambulates more in regular sneakers.  She denies any other treatment protocols for it.  She is also concerned of swelling around the injury site.   Review of Systems: Negative except as noted in the HPI. Denies N/V/F/Ch.  Past Medical History:  Diagnosis Date  . Diabetes mellitus without complication ([place])   . Fibromyalgia   . Lichen planus   . Osteoarthritis   . Osteoarthritis   . Vertigo     Current Outpatient Medications:  .  acetaminophen (TYLENOL) 500 MG tablet, Take 1,000 mg by mouth every 6 (six) hours as needed for mild pain., Disp: , Rfl:  .  Cholecalciferol (VITAMIN D3) 5000 units CAPS, Take 5,000 Units by mouth daily., Disp: , Rfl:  .  Chromium 500 MCG TABS, Take 500 mcg by mouth daily., Disp: , Rfl:  .  cyclobenzaprine (FLEXERIL) 10 MG tablet, Take 10 mg by mouth at bedtime as needed. For muscle spasms, Disp: , Rfl:  .  diazepam (VALIUM) 5 MG tablet, Take 5 mg by mouth every 6 (six) hours as needed. For anxiety, Disp: , Rfl:  .  diphenhydrAMINE (BENADRYL) 25 MG tablet, Take 25 mg by mouth every 6 (six) hours as needed for allergies., Disp: , Rfl:  .  hydrOXYzine (ATARAX/VISTARIL) 10 MG tablet, TAKE 1 TABLET BY MOUTH ONCE DAILY AT BEDTIME AS NEEDED FOR ITCHING FOR 90 DAYS, Disp: , Rfl:  .  MAGNESIUM  PO, Take 3 tablets by mouth daily., Disp: , Rfl:  .  meclizine (ANTIVERT) 12.5 MG tablet, Take 12.5 mg by mouth 3 (three) times daily as needed. dizziness, Disp: , Rfl:  .  methylPREDNISolone (MEDROL DOSEPAK) 4 MG TBPK tablet, Take as directed, Disp: 21 tablet, Rfl: 0 .  oxybutynin (DITROPAN) 5 MG tablet, TAKE 1 TABLET BY MOUTH TWICE DAILY AS NEEDED FOR URINARY FREQUENCY, Disp: , Rfl:  .  pantoprazole (PROTONIX) 40 MG tablet, , Disp: , Rfl:  .  PRESCRIPTION MEDICATION, Inject 1 Syringe into the muscle 3 (three) times a week. METHYLCOBALAMIN, Disp: , Rfl:  .  traMADol (ULTRAM) 50 MG tablet, Take 50 mg by mouth every 6 (six) hours as needed. For pain, Disp: , Rfl:  .  triamterene-hydrochlorothiazide (MAXZIDE) 75-50 MG tablet, Take 1 tablet by mouth 3 (three) times a week. For swelling, Disp: 12 tablet, Rfl: 1  Social History   Tobacco Use  Smoking Status Never Smoker  Smokeless Tobacco Never Used    Allergies  Allergen Reactions  . Mobic [Meloxicam] Shortness Of Breath  . Codeine     Unknown  . Demerol [Meperidine]     Unknown  . Fentanyl     Unknown  . Morphine And Related     Unknown  .  Percocet [Oxycodone-Acetaminophen]     Unknown   Objective:  There were no vitals filed for this visit. There is no height or weight on file to calculate BMI. Constitutional Well developed. Well nourished.  Vascular Dorsalis pedis pulses palpable bilaterally. Posterior tibial pulses palpable bilaterally. Capillary refill normal to all digits.  No cyanosis or clubbing noted. Pedal hair growth normal.  Neurologic Normal speech. Oriented to person, place, and time. Epicritic sensation to light touch grossly present bilaterally.  Dermatologic Nails well groomed and normal in appearance. No open wounds. No skin lesions.  Orthopedic:  Pain on palpation to the ATFL.  Patient on stress inversion of the right foot.  Mild painless eversion of the foot active and passive range of right foot.   motion.  Patient also complains of dorsal midfoot pain of the left side.  There is a pain on palpation to the dorsal midfoot of the left foot.  No pain along the Achilles tendon insertion or posterior tibial tendon no tenderness around the ankle joint.   Radiographs: Bilateral 2 views of skeletally mature adult.  Left foot shows beginning edges of arthritic changes to the dorsal midfoot.  No soft tissue edema or swelling noted across it.  Right foot does not show any bony abnormalities or any arthritic changes.   Assessment:   1. Pain in both feet   2. Sprain of anterior talofibular ligament of right ankle, initial encounter   3. Arthritis of left foot    Plan:  Patient was evaluated and treated and all questions answered.  ATFL right ankle sprain -Patient was educated on the etiology of the right ankle sprain.  Given that she has been ambulating range for the past couple weeks it has not had enough time to heal. -I recommend that she goes into a cam boot.  She was dispensed 1 cam boot to the right foot to allow for soft tissue healing.  Left foot dorsal midfoot arthritis -Educated the patient on aggravating factor of her fall on the right side.  This may have led to the arthritis of the left foot to flareup. -Steroid injection was given to the left dorsal midfoot to allow for acute inflammation to decrease. -See the injection detailed below.  A steroid injection was performed at Left dorsal midfoot using 0.5 1% plain Lidocaine and 1 cc of  Kenalog 10. This was well tolerated.   Return in about 1 month (around 12/06/2018).

## 2018-12-03 ENCOUNTER — Ambulatory Visit: Payer: Medicare Other | Admitting: Podiatry

## 2018-12-03 ENCOUNTER — Other Ambulatory Visit: Payer: Self-pay

## 2018-12-03 DIAGNOSIS — M79671 Pain in right foot: Secondary | ICD-10-CM

## 2018-12-03 DIAGNOSIS — M7751 Other enthesopathy of right foot: Secondary | ICD-10-CM

## 2018-12-03 DIAGNOSIS — M79672 Pain in left foot: Secondary | ICD-10-CM | POA: Diagnosis not present

## 2018-12-03 DIAGNOSIS — E119 Type 2 diabetes mellitus without complications: Secondary | ICD-10-CM

## 2018-12-03 DIAGNOSIS — S93491A Sprain of other ligament of right ankle, initial encounter: Secondary | ICD-10-CM

## 2018-12-03 DIAGNOSIS — M19072 Primary osteoarthritis, left ankle and foot: Secondary | ICD-10-CM

## 2018-12-04 ENCOUNTER — Encounter: Payer: Self-pay | Admitting: Podiatry

## 2018-12-04 NOTE — Progress Notes (Signed)
Subjective:  Patient ID: Jessica [name], female    DOB: 1960/07/15,  MRN: [id number]  Chief Complaint  Patient presents with  . Foot Pain    pt is here for a f/u of foot pain in both feet, pt also states that the swelling has gone down but not completely, pt also states that the foot pain in her left has not gone away, pt is taking tramadol and extra strength tyelenol, but is still feeling pain    58 y.o. female presents with the above complaint. Her pain has improved on the left midfoot with injection. She still has right lateral ankle pain but decreased with the boot. She also has secondary complaints of Left ankle joint pain. She states that the gait pattern had changed and she has been putting extra weight on the left side because of the CAM boot on the right. No other acute complaints. She has been ambulating with boot on but has not been most complaint with it.    Review of Systems: Negative except as noted in the HPI. Denies N/V/F/Ch.  Past Medical History:  Diagnosis Date  . Diabetes mellitus without complication ([place])   . Fibromyalgia   . Lichen planus   . Osteoarthritis   . Osteoarthritis   . Vertigo     Current Outpatient Medications:  .  acetaminophen (TYLENOL) 500 MG tablet, Take 1,000 mg by mouth every 6 (six) hours as needed for mild pain., Disp: , Rfl:  .  ALL DAY RELIEF 220 MG tablet, SMARTSIG:1 Tablet(s) By Mouth As Needed, Disp: , Rfl:  .  Cholecalciferol (VITAMIN D3) 5000 units CAPS, Take 5,000 Units by mouth daily., Disp: , Rfl:  .  Chromium 500 MCG TABS, Take 500 mcg by mouth daily., Disp: , Rfl:  .  cyclobenzaprine (FLEXERIL) 10 MG tablet, Take 10 mg by mouth at bedtime as needed. For muscle spasms, Disp: , Rfl:  .  diazepam (VALIUM) 5 MG tablet, Take 5 mg by mouth every 6 (six) hours as needed. For anxiety, Disp: , Rfl:  .  diazePAM 5 MG/5ML SOLN, 1 tablet PO 2 times a day, Disp: , Rfl:  .  diclofenac (VOLTAREN) 50 MG EC tablet, , Disp: , Rfl:  .   diphenhydrAMINE (BENADRYL) 25 MG tablet, Take 25 mg by mouth every 6 (six) hours as needed for allergies., Disp: , Rfl:  .  hydrOXYzine (ATARAX/VISTARIL) 10 MG tablet, TAKE 1 TABLET BY MOUTH ONCE DAILY AT BEDTIME AS NEEDED FOR ITCHING FOR 90 DAYS, Disp: , Rfl:  .  MAGNESIUM PO, Take 3 tablets by mouth daily., Disp: , Rfl:  .  meclizine (ANTIVERT) 12.5 MG tablet, Take 12.5 mg by mouth 3 (three) times daily as needed. dizziness, Disp: , Rfl:  .  methylPREDNISolone (MEDROL DOSEPAK) 4 MG TBPK tablet, Take as directed, Disp: 21 tablet, Rfl: 0 .  oxybutynin (DITROPAN) 5 MG tablet, TAKE 1 TABLET BY MOUTH TWICE DAILY AS NEEDED FOR URINARY FREQUENCY, Disp: , Rfl:  .  pantoprazole (PROTONIX) 40 MG injection, 40 mg daily., Disp: , Rfl:  .  pantoprazole (PROTONIX) 40 MG tablet, , Disp: , Rfl:  .  PRESCRIPTION MEDICATION, Inject 1 Syringe into the muscle 3 (three) times a week. METHYLCOBALAMIN, Disp: , Rfl:  .  traMADol (ULTRAM) 50 MG tablet, Take 50 mg by mouth every 6 (six) hours as needed. For pain, Disp: , Rfl:  .  triamterene-hydrochlorothiazide (MAXZIDE) 75-50 MG tablet, Take 1 tablet by mouth 3 (three) times a week. For swelling,  Disp: 12 tablet, Rfl: 1  Social History   Tobacco Use  Smoking Status Never Smoker  Smokeless Tobacco Never Used    Allergies  Allergen Reactions  . Mobic [Meloxicam] Shortness Of Breath  . Codeine     Unknown  . Demerol [Meperidine]     Unknown  . Fentanyl     Unknown  . Morphine And Related     Unknown  . Percocet [Oxycodone-Acetaminophen]     Unknown   Objective:  There were no vitals filed for this visit. There is no height or weight on file to calculate BMI. Constitutional Well developed. Well nourished.  Vascular Dorsalis pedis pulses palpable bilaterally. Posterior tibial pulses palpable bilaterally. Capillary refill normal to all digits.  No cyanosis or clubbing noted. Pedal hair growth normal.  Neurologic Normal speech. Oriented to person,  place, and time. Epicritic sensation to light touch grossly present bilaterally.  Dermatologic Nails well groomed and normal in appearance. No open wounds. No skin lesions.  Orthopedic: Pain on the palpation of the Right ATFL ligament. Pain increased with supination and inversion of the foot. No pain at the left lateral/dorsal midfoot. Pain on ROM active and passive of the left ankle joint. Deep achy pain on palpation of the left ankle joint.   Radiographs: None Assessment:   1. Pain in both feet   2. Controlled type 2 diabetes mellitus without complication, without long-term current use of insulin ([facility])   3. Sprain of anterior talofibular ligament of right ankle, initial encounter   4. Arthritis of left ankle    Plan:  Patient was evaluated and treated and all questions answered.  Left dorsal midfoot arthritis -Resolved.  The injection decreased/eliminated the pain.  Right ankle ATFL sprain -Cam boot had decreased the pain however the pain has not completely been resolved.  Patient states that it definitely improved with the boot.  Given that there is still some inflammation around the ligament possibly the underlying joint.  I believe patient will benefit from a steroid injection as described below -A steroid injection was performed at right lateral ankle using 1% plain Lidocaine and 10 mg of Kenalog. This was well tolerated. -Right foot ankle brace Tri-Lock was dispensed  Left ankle arthritis -It appears due to compensation using a cam boot patient's left ankle joint was aggravated and the underlying arthritis was aggravated. -I offered the patient a steroid injection however patient stated she wanted to hold off the injection.  I believe her pain will decrease as she is no longer in the cam boot.     Return in about 6 weeks (around 01/14/2019).

## 2018-12-08 ENCOUNTER — Encounter: Payer: Self-pay | Admitting: Podiatry

## 2018-12-08 NOTE — Progress Notes (Signed)
Medical records were placed up front to be mailed to patient tomorrow, 12/09/2018 per her signed medical records release form.

## 2019-01-14 ENCOUNTER — Ambulatory Visit: Payer: Medicare Other | Admitting: Podiatry

## 2019-04-02 ENCOUNTER — Ambulatory Visit: Payer: Medicare Other | Attending: Internal Medicine

## 2019-04-02 DIAGNOSIS — Z23 Encounter for immunization: Secondary | ICD-10-CM | POA: Insufficient documentation

## 2019-04-02 NOTE — Progress Notes (Signed)
   Covid-19 Vaccination Clinic  Name:  Jessica [name]    MRN: [id number] DOB: 04/28/1960  04/02/2019  [name] was observed post Covid-19 immunization for 30 minutes based on pre-vaccination screening without incident. She was provided with Vaccine Information Sheet and instruction to access the V-Safe system.   [name] was instructed to call 911 with any severe reactions post vaccine: Marland Kitchen Difficulty breathing  . Swelling of face and throat  . A fast heartbeat  . A bad rash all over body  . Dizziness and weakness   Immunizations Administered    Name Date Dose VIS Date Route   Moderna COVID-19 Vaccine 04/02/2019 12:29 PM 0.5 mL 12/30/2018 Intramuscular   Manufacturer: Moderna   Lot: [id number]   [id number]

## 2019-04-08 ENCOUNTER — Other Ambulatory Visit: Payer: Self-pay

## 2019-04-08 ENCOUNTER — Encounter: Payer: Self-pay | Admitting: Critical Care Medicine

## 2019-04-08 ENCOUNTER — Ambulatory Visit: Payer: Medicare Other | Admitting: Critical Care Medicine

## 2019-04-08 VITALS — BP 142/87 | HR 61 | Resp 16

## 2019-04-08 DIAGNOSIS — I10 Essential (primary) hypertension: Secondary | ICD-10-CM

## 2019-04-08 DIAGNOSIS — R079 Chest pain, unspecified: Secondary | ICD-10-CM | POA: Diagnosis not present

## 2019-04-08 MED ORDER — AMLODIPINE BESYLATE 10 MG PO TABS: 10.0000 mg | ORAL_TABLET | Freq: Every day | ORAL | 3 refills | Status: DC

## 2019-04-08 NOTE — Progress Notes (Signed)
Pt arrived to [facility] . Pt alert and oriented and arrLives in good spirits.  Pt states she had chest pain, SOB, HA, and dizziness. Denies blurred vision.  Felt more SOB today and noticed some chest pain today while in swim class.   Takes blood pressures at home.  Has a cardiologist and an appointment with them  on 3/25 Denies pain right now.    Verified medication with patient.  Blood pressure reading: 142/87. MD was notified of reading and more so symptoms she presents with.

## 2019-04-08 NOTE — Progress Notes (Signed)
Subjective:    Patient ID: Jessica [name], female    DOB: [birth year], 59 y.o.   MRN: [id number]  This is a pleasant 59 year old female who came to the mobile medicine clinic wishing to have her blood pressure checked.  When we initially brought the patient on board for an RN only visit we noted below the blood pressure is 142/87 she had been complaining of chest pain and shortness of breath for the past week with exertion.  Patient does have a history of cardiology evaluation with a stress test in 2018.  At that time she had significant exercise-induced hypertension with systolic hypertension primarily manifesting in the 180-199/80 range at peak exercise.  Other basal blood pressure readings have been in the 140/80 to 150/85 range  Patient states while performing her swimming exercises here at the [facility] she had noticed increased chest pain and shortness of breath.  The pain did not radiate.  Did go away after she got out of the sling below.  Note with her exercise stress test in 2018 there were no ischemic changes seen and she has not yet had an echocardiogram.  Patient also has history of type 2 diabetes osteoarthritis fibromyalgia   Patient denies any lower extremity edema.  She is been trying to change her diet recently to a healthier diet and recently increased her exercise program.  Her weight does remain in the 240 range  She has been using the Maxide only as needed but the last 3 days was taking 1 tablet on a daily basis  Patient does have an upcoming appointment with cardiology in 2 weeks and she does have an established primary care provider in the [place] system   Past Medical History:  Diagnosis Date  . Diabetes mellitus without complication ([place])   . Fibromyalgia   . Lichen planus   . Osteoarthritis   . Osteoarthritis   . Vertigo      History reviewed. No pertinent family history.   Social History   Socioeconomic History  . Marital status: Married    Spouse name: Not on  file  . Number of children: Not on file  . Years of education: Not on file  . Highest education level: Not on file  Occupational History  . Not on file  Tobacco Use  . Smoking status: Never Smoker  . Smokeless tobacco: Never Used  Substance and Sexual Activity  . Alcohol use: No  . Drug use: No  . Sexual activity: Not on file  Other Topics Concern  . Not on file  Social History Narrative  . Not on file   Social Determinants of Health   Financial Resource Strain:   . Difficulty of Paying Living Expenses: Not on file  Food Insecurity:   . Worried About Charity fundraiser in the Last Year: Not on file  . Ran Out of Food in the Last Year: Not on file  Transportation Needs:   . Lack of Transportation (Medical): Not on file  . Lack of Transportation (Non-Medical): Not on file  Physical Activity:   . Days of Exercise per Week: Not on file  . Minutes of Exercise per Session: Not on file  Stress:   . Feeling of Stress : Not on file  Social Connections:   . Frequency of Communication with Friends and Family: Not on file  . Frequency of Social Gatherings with Friends and Family: Not on file  . Attends Religious Services: Not on file  . Active Member  of Clubs or Organizations: Not on file  . Attends Archivist Meetings: Not on file  . Marital Status: Not on file  Intimate Partner Violence:   . Fear of Current or Ex-Partner: Not on file  . Emotionally Abused: Not on file  . Physically Abused: Not on file  . Sexually Abused: Not on file     Allergies  Allergen Reactions  . Mobic [Meloxicam] Shortness Of Breath  . Codeine     Unknown  . Demerol [Meperidine]     Unknown  . Fentanyl     Unknown  . Morphine And Related     Unknown  . Percocet [Oxycodone-Acetaminophen]     Unknown     Outpatient Medications Prior to Visit  Medication Sig Dispense Refill  . acetaminophen (TYLENOL) 500 MG tablet Take 1,000 mg by mouth every 6 (six) hours as needed for mild pain.     . Cholecalciferol (VITAMIN D3) 5000 units CAPS Take 5,000 Units by mouth daily.    . cyclobenzaprine (FLEXERIL) 10 MG tablet Take 10 mg by mouth at bedtime as needed. For muscle spasms    . diazepam (VALIUM) 5 MG tablet Take 5 mg by mouth every 6 (six) hours as needed. For anxiety    . diphenhydrAMINE (BENADRYL) 25 MG tablet Take 25 mg by mouth every 6 (six) hours as needed for allergies.    . hydrOXYzine (ATARAX/VISTARIL) 10 MG tablet TAKE 1 TABLET BY MOUTH ONCE DAILY AT BEDTIME AS NEEDED FOR ITCHING FOR 90 DAYS    . MAGNESIUM PO Take 3 tablets by mouth daily.    . meclizine (ANTIVERT) 12.5 MG tablet Take 12.5 mg by mouth 3 (three) times daily as needed. dizziness    . oxybutynin (DITROPAN) 5 MG tablet TAKE 1 TABLET BY MOUTH TWICE DAILY AS NEEDED FOR URINARY FREQUENCY    . pantoprazole (PROTONIX) 40 MG injection 40 mg daily.    . pantoprazole (PROTONIX) 40 MG tablet     . traMADol (ULTRAM) 50 MG tablet Take 50 mg by mouth every 6 (six) hours as needed. For pain    . triamterene-hydrochlorothiazide (MAXZIDE) 75-50 MG tablet Take 1 tablet by mouth 3 (three) times a week. For swelling (Patient taking differently: Take 1 tablet by mouth daily. ) 12 tablet 1  . ALL DAY RELIEF 220 MG tablet SMARTSIG:1 Tablet(s) By Mouth As Needed    . Chromium 500 MCG TABS Take 500 mcg by mouth daily.    . diazePAM 5 MG/5ML SOLN 1 tablet PO 2 times a day    . diclofenac (VOLTAREN) 50 MG EC tablet     . methylPREDNISolone (MEDROL DOSEPAK) 4 MG TBPK tablet Take as directed (Patient not taking: Reported on 04/08/2019) 21 tablet 0  . PRESCRIPTION MEDICATION Inject 1 tablet into the muscle 3 (three) times a week. METHYLCOBALAMIN     No facility-administered medications prior to visit.     Review of Systems Constitutional:   No  weight loss, night sweats,  Fevers, chills, fatigue, lassitude. HEENT:   No headaches,  Difficulty swallowing,  Tooth/dental problems,  Sore throat,                No sneezing, itching, ear  ache, nasal congestion, post nasal drip,   CV:   chest pain,  Orthopnea, PND, swelling in lower extremities, anasarca, dizziness, palpitations  GI  No heartburn, indigestion, abdominal pain, nausea, vomiting, diarrhea, change in bowel habits, loss of appetite  Resp:  shortness of breath with exertion  not at rest.  No excess mucus, no productive cough,  No non-productive cough,  No coughing up of blood.  No change in color of mucus.  No wheezing.  No chest wall deformity  Skin: no rash or lesions.  GU: no dysuria, change in color of urine, no urgency or frequency.  No flank pain.  MS:  No joint pain or swelling.  No decreased range of motion.  No back pain.  Psych:  No change in mood or affect. No depression or anxiety.  No memory loss.     Objective:   Physical Exam Vitals:   04/08/19 1127  BP: (!) 142/87  Pulse: 61  Resp: 16  SpO2: 100%    Gen: Pleasant, well-nourished, in no distress,  normal affect  ENT: No lesions,  mouth clear,  oropharynx clear, no postnasal drip  Neck: No JVD, no TMG, no carotid bruits  Lungs: No use of accessory muscles, no dullness to percussion, clear without rales or rhonchi  Cardiovascular: RRR, heart sounds normal, no murmur or gallops, no peripheral edema  Abdomen: soft and NT, no HSM,  BS normal  Musculoskeletal: No deformities, no cyanosis or clubbing  Neuro: alert, non focal  Skin: Warm, no lesions or rashes  No results found.  No labs obtained recently in our system as her primary care provider is in a different EHR      Assessment & Plan:  I personally reviewed all images and lab data in the [facility] system as well as any outside material available during this office visit and agree with the  radiology impressions.   Essential hypertension Hypertension not well controlled and significant systolic hypertension with exertion is noted from previous exercise stress test  I recommended we add amlodipine 10 mg daily and I sent a 20  count medication to her pharmacy and she will maintain Maxide daily  This plan to see her cardiologist soon therefore we did not draw any labs will obtain an EKG today and she is appear to be in a stable status  Plan will be for her to follow-up with cardiology and hold off on strenuous exercise until she sees cardiology  Exertional chest pain Exertional chest pain secondary to exercised induced systolic hypertension  The patient knows to follow-up with cardiology and to begin the amlodipine 10 mg daily   Diagnoses and all orders for this visit:  Essential hypertension  Exertional chest pain  Other orders -     amLODipine (NORVASC) 10 MG tablet; Take 1 tablet (10 mg total) by mouth daily.

## 2019-04-08 NOTE — Patient Instructions (Signed)
Start amlodipine 10mg  one tablet daily  Take maxzide daily one tablet  Keep your appt with Dr Tamala Julian    Hypertension, Adult High blood pressure (hypertension) is when the force of blood pumping through the arteries is too strong. The arteries are the blood vessels that carry blood from the heart throughout the body. Hypertension forces the heart to work harder to pump blood and may cause arteries to become narrow or stiff. Untreated or uncontrolled hypertension can cause a heart attack, heart failure, a stroke, kidney disease, and other problems. A blood pressure reading consists of a higher number over a lower number. Ideally, your blood pressure should be below 120/80. The first ("top") number is called the systolic pressure. It is a measure of the pressure in your arteries as your heart beats. The second ("bottom") number is called the diastolic pressure. It is a measure of the pressure in your arteries as the heart relaxes. What are the causes? The exact cause of this condition is not known. There are some conditions that result in or are related to high blood pressure. What increases the risk? Some risk factors for high blood pressure are under your control. The following factors may make you more likely to develop this condition:  Smoking.  Having type 2 diabetes mellitus, high cholesterol, or both.  Not getting enough exercise or physical activity.  Being overweight.  Having too much fat, sugar, calories, or salt (sodium) in your diet.  Drinking too much alcohol. Some risk factors for high blood pressure may be difficult or impossible to change. Some of these factors include:  Having chronic kidney disease.  Having a family history of high blood pressure.  Age. Risk increases with age.  Race. You may be at higher risk if you are African American.  Gender. Men are at higher risk than women before age 66. After age 77, women are at higher risk than men.  Having obstructive  sleep apnea.  Stress. What are the signs or symptoms? High blood pressure may not cause symptoms. Very high blood pressure (hypertensive crisis) may cause:  Headache.  Anxiety.  Shortness of breath.  Nosebleed.  Nausea and vomiting.  Vision changes.  Severe chest pain.  Seizures. How is this diagnosed? This condition is diagnosed by measuring your blood pressure while you are seated, with your arm resting on a flat surface, your legs uncrossed, and your feet flat on the floor. The cuff of the blood pressure monitor will be placed directly against the skin of your upper arm at the level of your heart. It should be measured at least twice using the same arm. Certain conditions can cause a difference in blood pressure between your right and left arms. Certain factors can cause blood pressure readings to be lower or higher than normal for a short period of time:  When your blood pressure is higher when you are in a health care provider's office than when you are at home, this is called white coat hypertension. Most people with this condition do not need medicines.  When your blood pressure is higher at home than when you are in a health care provider's office, this is called masked hypertension. Most people with this condition may need medicines to control blood pressure. If you have a high blood pressure reading during one visit or you have normal blood pressure with other risk factors, you may be asked to:  Return on a different day to have your blood pressure checked again.  Monitor  your blood pressure at home for 1 week or longer. If you are diagnosed with hypertension, you may have other blood or imaging tests to help your health care provider understand your overall risk for other conditions. How is this treated? This condition is treated by making healthy lifestyle changes, such as eating healthy foods, exercising more, and reducing your alcohol intake. Your health care provider  may prescribe medicine if lifestyle changes are not enough to get your blood pressure under control, and if:  Your systolic blood pressure is above 130.  Your diastolic blood pressure is above 80. Your personal target blood pressure may vary depending on your medical conditions, your age, and other factors. Follow these instructions at home: Eating and drinking   Eat a diet that is high in fiber and potassium, and low in sodium, added sugar, and fat. An example eating plan is called the DASH (Dietary Approaches to Stop Hypertension) diet. To eat this way: ? Eat plenty of fresh fruits and vegetables. Try to fill one half of your plate at each meal with fruits and vegetables. ? Eat whole grains, such as whole-wheat pasta, brown rice, or whole-grain bread. Fill about one fourth of your plate with whole grains. ? Eat or drink low-fat dairy products, such as skim milk or low-fat yogurt. ? Avoid fatty cuts of meat, processed or cured meats, and poultry with skin. Fill about one fourth of your plate with lean proteins, such as fish, chicken without skin, beans, eggs, or tofu. ? Avoid pre-made and processed foods. These tend to be higher in sodium, added sugar, and fat.  Reduce your daily sodium intake. Most people with hypertension should eat less than 1,500 mg of sodium a day.  Do not drink alcohol if: ? Your health care provider tells you not to drink. ? You are pregnant, may be pregnant, or are planning to become pregnant.  If you drink alcohol: ? Limit how much you use to:  0-1 drink a day for women.  0-2 drinks a day for men. ? Be aware of how much alcohol is in your drink. In the [place] one drink equals one 12 oz bottle of beer (355 mL), one 5 oz glass of wine (148 mL), or one 1 oz glass of hard liquor (44 mL). Lifestyle   Work with your health care provider to maintain a healthy body weight or to lose weight. Ask what an ideal weight is for you.  Get at least 30 minutes of exercise  most days of the week. Activities may include walking, swimming, or biking.  Include exercise to strengthen your muscles (resistance exercise), such as Pilates or lifting weights, as part of your weekly exercise routine. Try to do these types of exercises for 30 minutes at least 3 days a week.  Do not use any products that contain nicotine or tobacco, such as cigarettes, e-cigarettes, and chewing tobacco. If you need help quitting, ask your health care provider.  Monitor your blood pressure at home as told by your health care provider.  Keep all follow-up visits as told by your health care provider. This is important. Medicines  Take over-the-counter and prescription medicines only as told by your health care provider. Follow directions carefully. Blood pressure medicines must be taken as prescribed.  Do not skip doses of blood pressure medicine. Doing this puts you at risk for problems and can make the medicine less effective.  Ask your health care provider about side effects or reactions to medicines that  you should watch for. Contact a health care provider if you:  Think you are having a reaction to a medicine you are taking.  Have headaches that keep coming back (recurring).  Feel dizzy.  Have swelling in your ankles.  Have trouble with your vision. Get help right away if you:  Develop a severe headache or confusion.  Have unusual weakness or numbness.  Feel faint.  Have severe pain in your chest or abdomen.  Vomit repeatedly.  Have trouble breathing. Summary  Hypertension is when the force of blood pumping through your arteries is too strong. If this condition is not controlled, it may put you at risk for serious complications.  Your personal target blood pressure may vary depending on your medical conditions, your age, and other factors. For most people, a normal blood pressure is less than 120/80.  Hypertension is treated with lifestyle changes, medicines, or a  combination of both. Lifestyle changes include losing weight, eating a healthy, low-sodium diet, exercising more, and limiting alcohol. This information is not intended to replace advice given to you by your health care provider. Make sure you discuss any questions you have with your health care provider. Document Revised: 09/25/2017 Document Reviewed: 09/25/2017 Elsevier Patient Education  2020 [employer].

## 2019-04-08 NOTE — Assessment & Plan Note (Signed)
Exertional chest pain secondary to exercised induced systolic hypertension  The patient knows to follow-up with cardiology and to begin the amlodipine 10 mg daily

## 2019-04-08 NOTE — Assessment & Plan Note (Signed)
Hypertension not well controlled and significant systolic hypertension with exertion is noted from previous exercise stress test  I recommended we add amlodipine 10 mg daily and I sent a 20 count medication to her pharmacy and she will maintain Maxide daily  This plan to see her cardiologist soon therefore we did not draw any labs will obtain an EKG today and she is appear to be in a stable status  Plan will be for her to follow-up with cardiology and hold off on strenuous exercise until she sees cardiology

## 2019-04-16 NOTE — Progress Notes (Signed)
Cardiology Office Note:    Date:  04/17/2019   ID:  Jessica [name], DOB Jun 26, 1960, MRN [id number]  PCP:  Shirline Frees, MD  Cardiologist:  No primary care provider on file.   Referring MD: Donald Prose, MD   Chief Complaint  Patient presents with  . Congestive Heart Failure  . Hypertension    History of Present Illness:    Jessica [name] is a 59 y.o. female with a hx of intermittent chest pain and palpitations.   Relatively young female with family history of CAD, prediabetes, and hypertension who has in the past has begun having exertional chest tightness.  Work-up 3 years ago demonstrated severe blood pressure elevation with exercise.  Intensification of antihypertensive therapy led to resolution.  More recently her blood pressure medications have been changed around by her primary physician.  She is not having discomfort at rest.  She became concerned when the chest discomfort chronic and recurrent with exercise.  She has subsequently stopped exercising.  She otherwise is doing okay.  The current medication regimen includes Maxide 75/50 mg once per day and amlodipine 10 mg/day.  Past Medical History:  Diagnosis Date  . Anxiety   . Chest pain   . Chronic pain syndrome   . DDD (degenerative disc disease), lumbar   . Diabetes mellitus without complication ([place])   . Edema   . Family history of colonic polyps   . Fibroids   . Fibromyalgia   . GERD without esophagitis   . Hypertension   . Hypertensive retinopathy of both eyes   . Hypoglycemia   . Lichen planus   . Lichen planus   . Localized edema   . Morbid obesity due to excess calories ([place])   . Osteoarthritis   . Other constipation   . Overactive bladder   . Palpitations   . Right-sided abdominal pain of unknown cause   . RLQ abdominal pain   . RUQ abdominal pain   . Thyroid cyst   . Type 2 diabetes mellitus without complication, without long-term current use of insulin ([place])   . Varicose veins of both lower  extremities with pain   . Vertigo   . Vitamin D deficiency     Past Surgical History:  Procedure Laterality Date  . arthroscopic knee    . CESAREAN SECTION    . HYSTEROSCOPY      Current Medications: Current Meds  Medication Sig  . acetaminophen (TYLENOL) 500 MG tablet Take 1,000 mg by mouth every 6 (six) hours as needed for mild pain.  Marland Kitchen amLODipine (NORVASC) 10 MG tablet Take 1 tablet (10 mg total) by mouth daily.  . Cholecalciferol (VITAMIN D3) 5000 units CAPS Take 5,000 Units by mouth daily.  . cyclobenzaprine (FLEXERIL) 10 MG tablet Take 10 mg by mouth at bedtime as needed. For muscle spasms  . diazepam (VALIUM) 5 MG tablet Take 5 mg by mouth every 6 (six) hours as needed. For anxiety  . diazePAM 5 MG/5ML SOLN 1 tablet PO 2 times a day  . hydrOXYzine (ATARAX/VISTARIL) 10 MG tablet TAKE 1 TABLET BY MOUTH ONCE DAILY AT BEDTIME AS NEEDED FOR ITCHING FOR 90 DAYS  . MAGNESIUM PO Take 3 tablets by mouth daily.  . meclizine (ANTIVERT) 12.5 MG tablet Take 12.5 mg by mouth 3 (three) times daily as needed. dizziness  . methylPREDNISolone (MEDROL DOSEPAK) 4 MG TBPK tablet Take as directed  . oxybutynin (DITROPAN) 5 MG tablet TAKE 1 TABLET BY MOUTH TWICE DAILY AS NEEDED  FOR URINARY FREQUENCY  . pantoprazole (PROTONIX) 40 MG tablet   . PRESCRIPTION MEDICATION Inject 1 tablet into the muscle 3 (three) times a week. METHYLCOBALAMIN  . traMADol (ULTRAM) 50 MG tablet Take 50 mg by mouth every 6 (six) hours as needed. For pain  . [DISCONTINUED] amLODipine (NORVASC) 10 MG tablet Take 1 tablet (10 mg total) by mouth daily.  . [DISCONTINUED] triamterene-hydrochlorothiazide (MAXZIDE) 75-50 MG tablet Take 1 tablet by mouth 3 (three) times a week. For swelling (Patient taking differently: Take 1 tablet by mouth daily. )     Allergies:   Mobic [meloxicam], Codeine, Demerol [meperidine], Dilaudid [hydromorphone], Fentanyl, Griseofulvin ultramicrosize [griseofulvin], Lyrica [pregabalin], Morphine and  related, Neurontin [gabapentin], Percocet [oxycodone-acetaminophen], and Vicodin [hydrocodone-acetaminophen]   Social History   Socioeconomic History  . Marital status: Married    Spouse name: Not on file  . Number of children: Not on file  . Years of education: Not on file  . Highest education level: Not on file  Occupational History  . Not on file  Tobacco Use  . Smoking status: Never Smoker  . Smokeless tobacco: Never Used  Substance and Sexual Activity  . Alcohol use: No  . Drug use: No  . Sexual activity: Not on file  Other Topics Concern  . Not on file  Social History Narrative  . Not on file   Social Determinants of Health   Financial Resource Strain:   . Difficulty of Paying Living Expenses:   Food Insecurity:   . Worried About Charity fundraiser in the Last Year:   . Arboriculturist in the Last Year:   Transportation Needs:   . Film/video editor (Medical):   Marland Kitchen Lack of Transportation (Non-Medical):   Physical Activity:   . Days of Exercise per Week:   . Minutes of Exercise per Session:   Stress:   . Feeling of Stress :   Social Connections:   . Frequency of Communication with Friends and Family:   . Frequency of Social Gatherings with Friends and Family:   . Attends Religious Services:   . Active Member of Clubs or Organizations:   . Attends Archivist Meetings:   Marland Kitchen Marital Status:      Family History: The patient's family history includes CAD in her father; Colon polyps in her mother and sister; Diabetes in her maternal grandfather, maternal grandmother, mother, paternal grandfather, sister, and sister; Heart disease in her mother; Heart failure in her father; Hyperlipidemia in her mother; Hypertension in her maternal grandmother; Stroke in her paternal grandmother.  ROS:   Please see the history of present illness.    She sleeps less than 6 hours/day.  Does not snore.  She has not had lower extremity edema of significance until amlodipine  10 mg/day was started within the past month.  All other systems reviewed and are negative.  EKGs/Labs/Other Studies Reviewed:    The following studies were reviewed today: Exercise treadmill test performed 02/23/2016: Study Highlights    Blood pressure demonstrated a hypertensive response to exercise.  There was no ST segment deviation noted during stress.  No T wave inversion was noted during stress.  Overall, the patient's exercise capacity was normal.  [facility] Treadmill Score: low risk   Negative stress test without evidence of ischemia at given workload.    EKG:  EKG normal sinus rhythm, normal EKG.  No change compared to prior  Recent Labs: No results found for requested labs within last 8760 hours.  Recent Lipid Panel No results found for: CHOL, TRIG, HDL, CHOLHDL, VLDL, LDLCALC, LDLDIRECT  Physical Exam:    VS:  BP 128/86   Pulse 77   Ht '5\' 3"'  (1.6 m)   Wt 236 lb 12.8 oz (107.4 kg)   SpO2 97%   BMI 41.95 kg/m     Wt Readings from Last 3 Encounters:  04/17/19 236 lb 12.8 oz (107.4 kg)  02/20/16 241 lb (109.3 kg)  06/01/13 250 lb (113.4 kg)     GEN: Obese. No acute distress HEENT: Normal NECK: No JVD. LYMPHATICS: No lymphadenopathy CARDIAC:  RRR without murmur, gallop, or edema. VASCULAR:  Normal Pulses. No bruits. RESPIRATORY:  Clear to auscultation without rales, wheezing or rhonchi  ABDOMEN: Soft, non-tender, non-distended, No pulsatile mass, MUSCULOSKELETAL: No deformity  SKIN: Warm and dry NEUROLOGIC:  Alert and oriented x 3 PSYCHIATRIC:  Normal affect   ASSESSMENT:    1. Chest discomfort   2. Palpitations   3. Essential hypertension   4. Controlled type 2 diabetes mellitus with complication, without long-term current use of insulin ([place])   5. Educated about COVID-19 virus infection    PLAN:    In order of problems listed above:  1. Possibly driven by hypertension.  Cannot exclude the possibility of obstructive disease.  We will first  attempt to better control her blood pressure which on repeat recording by me using a large cuff in the left arm is 168/92 mmHg.  Have recommended she discontinue Maxide 75/50 mg and start losartan 50/12.5 mg/day.  She should return in 2 weeks, have been met done, and be seen in the blood pressure clinic.  I will see her again in 1 to 2 months. 2. Not a significant problem 3. Blood pressure as noted above.  Neck step for blood pressure control may be to add low-dose Aldactone or beta-blocker to the mix.  Further up titration of ARB would also be appropriate. 4. The most recent hemoglobin A1c from October was 6.1. 5. She has gotten the first half of her Covid vaccination.  She is practicing social distancing.  Plan follow-up in 4 to 6 weeks.  Blood pressure clinic in 2 to 3 weeks.  Be met at the same time as the clinic.  Hold off on aggressive physical activity until blood pressure is under good control which in this situation will require resting blood pressure at least 2 hours after medications on board of less than or equal to 130/80 mmHg.   Medication Adjustments/Labs and Tests Ordered: Current medicines are reviewed at length with the patient today.  Concerns regarding medicines are outlined above.  Orders Placed This Encounter  Procedures  . Basic metabolic panel  . EKG 12-Lead   Meds ordered this encounter  Medications  . losartan-hydrochlorothiazide (HYZAAR) 50-12.5 MG tablet    Sig: Take 1 tablet by mouth daily.    Dispense:  90 tablet    Refill:  3    D/c Maxzide  . amLODipine (NORVASC) 10 MG tablet    Sig: Take 1 tablet (10 mg total) by mouth daily.    Dispense:  90 tablet    Refill:  3    Patient Instructions  Medication Instructions:  1) DISCONTINUE Maxzide 2) START Losartan/HCTZ 50/12.21m once daily  *If you need a refill on your cardiac medications before your next appointment, please call your pharmacy*   Lab Work: BMET when you come to see the Hypertension  Clinic  If you have labs (blood work) drawn today  and your tests are completely normal, you will receive your results only by: Marland Kitchen MyChart Message (if you have MyChart) OR . A paper copy in the mail If you have any lab test that is abnormal or we need to change your treatment, we will call you to review the results.   Testing/Procedures: None   Follow-Up: At [facility], you and your health needs are our priority.  As part of our continuing mission to provide you with exceptional heart care, we have created designated Provider Care Teams.  These Care Teams include your primary Cardiologist (physician) and Advanced Practice Providers (APPs -  Physician Assistants and Nurse Practitioners) who all work together to provide you with the care you need, when you need it.  We recommend signing up for the patient portal called "MyChart".  Sign up information is provided on this After Visit Summary.  MyChart is used to connect with patients for Virtual Visits (Telemedicine).  Patients are able to view lab/test results, encounter notes, upcoming appointments, etc.  Non-urgent messages can be sent to your provider as well.   To learn more about what you can do with MyChart, go to [email or web address].    Your next appointment:   2-3 month(s)  The format for your next appointment:   In Person  Provider:   You may see Dr. Daneen Schick or one of the following Advanced Practice Providers on your designated Care Team:    Truitt Merle, NP  Cecilie Kicks, NP  Kathyrn Drown, NP    Other Instructions  Your physician recommends that you come in to see our Hypertension Clinic in 2-3 weeks.      Signed, Sinclair Grooms, MD  04/17/2019 5:08 PM    [facility]

## 2019-04-17 ENCOUNTER — Ambulatory Visit: Payer: Medicare Other | Admitting: Interventional Cardiology

## 2019-04-17 ENCOUNTER — Other Ambulatory Visit: Payer: Self-pay

## 2019-04-17 VITALS — BP 128/86 | HR 77 | Ht 63.0 in | Wt 236.8 lb

## 2019-04-17 DIAGNOSIS — E118 Type 2 diabetes mellitus with unspecified complications: Secondary | ICD-10-CM | POA: Diagnosis not present

## 2019-04-17 DIAGNOSIS — Z7189 Other specified counseling: Secondary | ICD-10-CM

## 2019-04-17 DIAGNOSIS — I10 Essential (primary) hypertension: Secondary | ICD-10-CM

## 2019-04-17 DIAGNOSIS — R0789 Other chest pain: Secondary | ICD-10-CM

## 2019-04-17 DIAGNOSIS — R002 Palpitations: Secondary | ICD-10-CM | POA: Diagnosis not present

## 2019-04-17 MED ORDER — AMLODIPINE BESYLATE 10 MG PO TABS: 10.0000 mg | ORAL_TABLET | Freq: Every day | ORAL | 3 refills | Status: DC

## 2019-04-17 MED ORDER — LOSARTAN POTASSIUM-HCTZ 50-12.5 MG PO TABS: 1.0000 | ORAL_TABLET | Freq: Every day | ORAL | 3 refills | Status: DC

## 2019-04-17 NOTE — Patient Instructions (Signed)
Medication Instructions:  1) DISCONTINUE Maxzide 2) START Losartan/HCTZ 50/12.5mg  once daily  *If you need a refill on your cardiac medications before your next appointment, please call your pharmacy*   Lab Work: BMET when you come to see the Hypertension Clinic  If you have labs (blood work) drawn today and your tests are completely normal, you will receive your results only by: Marland Kitchen MyChart Message (if you have MyChart) OR . A paper copy in the mail If you have any lab test that is abnormal or we need to change your treatment, we will call you to review the results.   Testing/Procedures: None   Follow-Up: At [facility], you and your health needs are our priority.  As part of our continuing mission to provide you with exceptional heart care, we have created designated Provider Care Teams.  These Care Teams include your primary Cardiologist (physician) and Advanced Practice Providers (APPs -  Physician Assistants and Nurse Practitioners) who all work together to provide you with the care you need, when you need it.  We recommend signing up for the patient portal called "MyChart".  Sign up information is provided on this After Visit Summary.  MyChart is used to connect with patients for Virtual Visits (Telemedicine).  Patients are able to view lab/test results, encounter notes, upcoming appointments, etc.  Non-urgent messages can be sent to your provider as well.   To learn more about what you can do with MyChart, go to [email or web address].    Your next appointment:   2-3 month(s)  The format for your next appointment:   In Person  Provider:   You may see Dr. Daneen Schick or one of the following Advanced Practice Providers on your designated Care Team:    Truitt Merle, NP  Cecilie Kicks, NP  Kathyrn Drown, NP    Other Instructions  Your physician recommends that you come in to see our Hypertension Clinic in 2-3 weeks.

## 2019-04-23 ENCOUNTER — Ambulatory Visit: Payer: Medicare Other | Admitting: Interventional Cardiology

## 2019-04-24 ENCOUNTER — Telehealth: Payer: Self-pay | Admitting: Interventional Cardiology

## 2019-04-24 MED ORDER — AMLODIPINE BESYLATE 5 MG PO TABS: 5.0000 mg | ORAL_TABLET | Freq: Every day | ORAL | 3 refills | Status: DC

## 2019-04-24 NOTE — Telephone Encounter (Signed)
Pt c/o swelling: STAT is pt has developed SOB within 24 hours  1) How much weight have you gained and in what time span? Not sure, has not been weighed  2) If swelling, where is the swelling located? Ankles and legs   3) Are you currently taking a fluid pill? Yes   4) Are you currently SOB? no  5) Do you have a log of your daily weights (if so, list)? no  6) Have you gained 3 pounds in a day or 5 pounds in a week? Not sure, has not been weighed  7) Have you traveled recently? No   Patient states ever since she started the amLODipine (NORVASC) 10 MG tablet on 04/15/2019 she has been swelling in her legs and ankles. Please advise.

## 2019-04-24 NOTE — Telephone Encounter (Signed)
Spoke with Dr. Tamala Julian and he said to have pt hold Amlodipine x 2 days and then decrease to 5mg  QD.  Spoke with pt and reviewed these recommendations.  Advised pt to keep plan for labs and Pharmacy visit on 4/6.  Pt verbalized understanding and was in agreement with this plan.

## 2019-04-30 NOTE — Telephone Encounter (Signed)
Patient called back and said she followed the instructions from Dr. Tamala Julian but her legs are still swelling. She also notices she is more SOB than she was before she started taking this medication.  She knows she has an appt next week and she really just doesn't want to take the medication. Please advise

## 2019-04-30 NOTE — Telephone Encounter (Signed)
Pt stopped Amlodipine for 3 days instead of 2 and restarted at 5mg  QD.  Swelling resolved while off of Amlodipine.  She noticed swelling returning after 2 doses of 5mg .  Also noticed she has been more SOB when exerting.  Gets winded when speaking at times.  BPs yesterday were 103/88, 108/86 and 107/71.  Pt did not take Amlodipine this morning.  Hasn't checked BP today.  Pt is scheduled to see HTN clinic next week.  Advised I will send to Dr. Tamala Julian for review and recommendations.

## 2019-05-02 NOTE — Telephone Encounter (Signed)
Okay to stop amlodipine and we will see BP in clinic.

## 2019-05-04 NOTE — Telephone Encounter (Signed)
Spoke with pt and reviewed recommendations.  Advised pt to keep appt tomorrow with HTN clinic.  Pt verbalized understanding and was appreciative for call.

## 2019-05-05 ENCOUNTER — Telehealth: Payer: Self-pay | Admitting: Pharmacist

## 2019-05-05 ENCOUNTER — Ambulatory Visit: Payer: Medicare Other | Attending: Family

## 2019-05-05 ENCOUNTER — Ambulatory Visit: Payer: Medicare Other

## 2019-05-05 ENCOUNTER — Other Ambulatory Visit: Payer: Self-pay

## 2019-05-05 ENCOUNTER — Ambulatory Visit (INDEPENDENT_AMBULATORY_CARE_PROVIDER_SITE_OTHER): Payer: Medicare Other | Admitting: Pharmacist

## 2019-05-05 ENCOUNTER — Other Ambulatory Visit: Payer: Medicare Other

## 2019-05-05 VITALS — BP 132/82 | HR 64

## 2019-05-05 DIAGNOSIS — I10 Essential (primary) hypertension: Secondary | ICD-10-CM | POA: Diagnosis not present

## 2019-05-05 DIAGNOSIS — Z23 Encounter for immunization: Secondary | ICD-10-CM

## 2019-05-05 DIAGNOSIS — R0789 Other chest pain: Secondary | ICD-10-CM

## 2019-05-05 LAB — BASIC METABOLIC PANEL
BUN/Creatinine Ratio: 13 (ref 9–23)
BUN: 11 mg/dL (ref 6–24)
CO2: 27 mmol/L (ref 20–29)
Calcium: 9.6 mg/dL (ref 8.7–10.2)
Chloride: 101 mmol/L (ref 96–106)
Creatinine, Ser: 0.84 mg/dL (ref 0.57–1.00)
GFR calc Af Amer: 89 mL/min/{1.73_m2} (ref >59)
GFR calc non Af Amer: 77 mL/min/{1.73_m2} (ref >59)
Glucose: 90 mg/dL (ref 65–99)
Potassium: 4.3 mmol/L (ref 3.5–5.2)
Sodium: 141 mmol/L (ref 134–144)

## 2019-05-05 MED ORDER — LOSARTAN POTASSIUM-HCTZ 100-25 MG PO TABS: 1.0000 | ORAL_TABLET | Freq: Every day | ORAL | 3 refills | Status: DC

## 2019-05-05 NOTE — Progress Notes (Signed)
   Covid-19 Vaccination Clinic  Name:  Jessica [name]    MRN: [id number] DOB: Jan 20, 1961  05/05/2019  Jessica [name] was observed post Covid-19 immunization for 15 minutes without incident. She was provided with Vaccine Information Sheet and instruction to access the V-Safe system.   Jessica [name] was instructed to call 911 with any severe reactions post vaccine: Marland Kitchen Difficulty breathing  . Swelling of face and throat  . A fast heartbeat  . A bad rash all over body  . Dizziness and weakness   Immunizations Administered    Name Date Dose VIS Date Route   Moderna COVID-19 Vaccine 05/05/2019  3:50 PM 0.5 mL 12/30/2018 Intramuscular   Manufacturer: Moderna   Lot: [id number]   [id number]

## 2019-05-05 NOTE — Progress Notes (Signed)
Patient ID: Jessica [name]                 DOB: 04/12/1960                      MRN: [id number]     HPI: Jessica [name] is a 59 y.o. female referred by Dr. Tamala Julian to HTN clinic. PMH is significant for DM, HTN, chest discomfort.  Work-up 3 years ago demonstrated severe blood pressure elevation with exercise.  Intensification of antihypertensive therapy led to resolution.  More recently her blood pressure medications have been changed around by her primary physician.  She is not having discomfort at rest.  She became concerned when the chest discomfort chronic and recurrent with exercise.  She has subsequently stopped exercising.  At last visit with Dr. Tamala Julian on 3/19 patients maxide was stopped and losartan 50/12.5mg  was started. She called the office on 3/26 reporting ankle swelling. Amlodipine was held for 2 days and then decreased to 5mg  daily. However patient reports swelling improved off of amlodipine and then resuming again when amlodipine was restarted, even at the lower dose.  Patient presents today for initial visit with HTN clinic. Patient reports blood pressures ranging from 100's-130's (states not usually in the 130's) over 80-90's with some diastolic [id number]. She has tried to walk a little but it took her an hour to walk 2 miles and she was short of breath. She brought her OMRON blood pressure cuff to clinic today. Appears accurate, although I did get a diastolic reading a little lower than her machine did, however sounds were soft. I will check both manually and with clinic automatic cuff next visit to compare.  OMRON cuff-138/95 HR 64 Clinic reading -132/82  Patient states that she will often wake up at 2AM in pain. She is trying to just take her pain meds and go back to sleep. She goes to bed around 11 PM and wakes up at 6AM. States sometimes she doesn't sleep. Reports having pepperoni and sausage pizza last night which is out of the norm.  Current HTN meds: losartan 50/12.5mg  Previously  tried: amlodipine (edema with 5 and 10mg ), Maxzide (changed to losartan/hctz) BP goal: <130/80  Family History: The patient's family history includes CAD in her father; Colon polyps in her mother and sister; Diabetes in her maternal grandfather, maternal grandmother, mother, paternal grandfather, sister, and sister; Heart disease in her mother; Heart failure in her father; Hyperlipidemia in her mother; Hypertension in her maternal grandmother; Stroke in her paternal grandmother.  Social History: no tobacco, 3-4 oz of red wine in the evening  Diet:   Exercise: use to walk 4 miles and do body pump. Has not done much since seeing dr. Tamala Julian due to chest tightness  Home BP readings: 117/100 HR 60, 121/93 HR 55 (indigestion), 133/98 HR 53  Wt Readings from Last 3 Encounters:  04/17/19 236 lb 12.8 oz (107.4 kg)  02/20/16 241 lb (109.3 kg)  06/01/13 250 lb (113.4 kg)   BP Readings from Last 3 Encounters:  04/17/19 128/86  04/08/19 (!) 142/87  02/20/16 (!) 152/90   Pulse Readings from Last 3 Encounters:  04/17/19 77  04/08/19 61  02/20/16 69    Renal function: CrCl cannot be calculated (Patient's most recent lab result is older than the maximum 21 days allowed.).  Past Medical History:  Diagnosis Date  . Anxiety   . Chest pain   . Chronic pain syndrome   . DDD (degenerative  disc disease), lumbar   . Diabetes mellitus without complication ([place])   . Edema   . Family history of colonic polyps   . Fibroids   . Fibromyalgia   . GERD without esophagitis   . Hypertension   . Hypertensive retinopathy of both eyes   . Hypoglycemia   . Lichen planus   . Lichen planus   . Localized edema   . Morbid obesity due to excess calories ([place])   . Osteoarthritis   . Other constipation   . Overactive bladder   . Palpitations   . Right-sided abdominal pain of unknown cause   . RLQ abdominal pain   . RUQ abdominal pain   . Thyroid cyst   . Type 2 diabetes mellitus without complication,  without long-term current use of insulin ([place])   . Varicose veins of both lower extremities with pain   . Vertigo   . Vitamin D deficiency     Current Outpatient Medications on File Prior to Visit  Medication Sig Dispense Refill  . acetaminophen (TYLENOL) 500 MG tablet Take 1,000 mg by mouth every 6 (six) hours as needed for mild pain.    . ALL DAY RELIEF 220 MG tablet SMARTSIG:1 Tablet(s) By Mouth As Needed    . amLODipine (NORVASC) 5 MG tablet Take 1 tablet (5 mg total) by mouth daily. 90 tablet 3  . Cholecalciferol (VITAMIN D3) 5000 units CAPS Take 5,000 Units by mouth daily.    . Chromium 500 MCG TABS Take 500 mcg by mouth daily.    . cyclobenzaprine (FLEXERIL) 10 MG tablet Take 10 mg by mouth at bedtime as needed. For muscle spasms    . diazepam (VALIUM) 5 MG tablet Take 5 mg by mouth every 6 (six) hours as needed. For anxiety    . diazePAM 5 MG/5ML SOLN 1 tablet PO 2 times a day    . diclofenac (VOLTAREN) 50 MG EC tablet     . diphenhydrAMINE (BENADRYL) 25 MG tablet Take 25 mg by mouth every 6 (six) hours as needed for allergies.    . hydrOXYzine (ATARAX/VISTARIL) 10 MG tablet TAKE 1 TABLET BY MOUTH ONCE DAILY AT BEDTIME AS NEEDED FOR ITCHING FOR 90 DAYS    . losartan-hydrochlorothiazide (HYZAAR) 50-12.5 MG tablet Take 1 tablet by mouth daily. 90 tablet 3  . MAGNESIUM PO Take 3 tablets by mouth daily.    . meclizine (ANTIVERT) 12.5 MG tablet Take 12.5 mg by mouth 3 (three) times daily as needed. dizziness    . methylPREDNISolone (MEDROL DOSEPAK) 4 MG TBPK tablet Take as directed 21 tablet 0  . oxybutynin (DITROPAN) 5 MG tablet TAKE 1 TABLET BY MOUTH TWICE DAILY AS NEEDED FOR URINARY FREQUENCY    . pantoprazole (PROTONIX) 40 MG injection 40 mg daily.    . pantoprazole (PROTONIX) 40 MG tablet     . PRESCRIPTION MEDICATION Inject 1 tablet into the muscle 3 (three) times a week. METHYLCOBALAMIN    . traMADol (ULTRAM) 50 MG tablet Take 50 mg by mouth every 6 (six) hours as needed. For  pain     No current facility-administered medications on file prior to visit.    Allergies  Allergen Reactions  . Mobic [Meloxicam] Shortness Of Breath  . Codeine     Unknown  . Demerol [Meperidine]     Unknown  . Dilaudid [Hydromorphone]   . Fentanyl     Unknown  . Griseofulvin Ultramicrosize [Griseofulvin]   . Lyrica [Pregabalin]   . Morphine And Related  Unknown  . Neurontin [Gabapentin]   . Percocet [Oxycodone-Acetaminophen]     Unknown  . Vicodin [Hydrocodone-Acetaminophen]     There were no vitals taken for this visit.   Assessment/Plan:  1. Hypertension - Blood pressure above goal of <130/80 in clinic today. Home systolic readings generally well controlled, but diastolic elevated. Blood pressure might be more elevated today due to high sodium meal last night. Patient was educated on foods high in sodium to avoid and the recommendation of no more than 2300mg  of sodium per day. We discussed only mild exercise (such as walking) until blood pressure is under better control. We also discussed going to bed earlier and trying to get at least 6 hours on uninterrupted sleep. Also discussed limiting fluids at night. Patient had BMP drawn today. Results pending. Will plan to increase losartan 50/12.5mg  to 100/25mg  as long as stable. Will call patient with results and final plan. Follow up in clinic in 2 weeks.   Thank you  Ramond Dial, Pharm.D, BCPS, CPP [place]  [id number] [address], [place], [place] [address]  Phone: [phone]; Fax: [phone]

## 2019-05-05 NOTE — Patient Instructions (Signed)
It was a pleasure to meet you today!  We will call you this afternoon or tomorrow with your labs results.  Tentative plan is to increase losartan/HCTZ to 100mg /25mg  daily  Please call us at [phone] with any questions or concerns  Before checking your blood pressure make sure: You are seated and quite for 5 min before checking Feet are flat on the floor Siting in chair with your back supported straight up and down Arm resting on table or arm of chair at heart level Bladder is empty You have NOT had caffeine or tobacco within the last 30 min  Check your blood pressure 2-3 times about 1-2 min apart. Usually the first reading will be the highest. Record these readings.  Only check your blood pressure once a day, unless otherwise directed Record your blood pressure readings and bring them to all your appointments. If your meter stores your readings in its memory, then you may bring your blood pressure meter with you to your appointments.  You can find a list of validated (accurate) blood pressure cuffs at [employer]  Lifestyle changes can make a world of difference and are even more important than medications: Try to keep your sodium intake to 2300 mg of sodium per day Get 6-8 uninterrupted hours of sleep per night Aim for a goal of 150 min of moderate aerobic exercise (ie brisk walking, bike riding) per week

## 2019-05-05 NOTE — Telephone Encounter (Signed)
BMP results stable. Called patient and advised her to increase her losartan/HCTZ to 100/25mg  daily. She can take 2 of the 50/12.5mg  until she runs out. Follow up in the office in 2 weeks.

## 2019-05-19 ENCOUNTER — Other Ambulatory Visit: Payer: Self-pay

## 2019-05-19 ENCOUNTER — Ambulatory Visit (INDEPENDENT_AMBULATORY_CARE_PROVIDER_SITE_OTHER): Payer: Medicare Other | Admitting: Pharmacist

## 2019-05-19 VITALS — BP 128/80 | HR 58

## 2019-05-19 DIAGNOSIS — I10 Essential (primary) hypertension: Secondary | ICD-10-CM

## 2019-05-19 LAB — BASIC METABOLIC PANEL
BUN/Creatinine Ratio: 18 (ref 9–23)
BUN: 15 mg/dL (ref 6–24)
CO2: 28 mmol/L (ref 20–29)
Calcium: 9.6 mg/dL (ref 8.7–10.2)
Chloride: 99 mmol/L (ref 96–106)
Creatinine, Ser: 0.84 mg/dL (ref 0.57–1.00)
GFR calc Af Amer: 89 mL/min/{1.73_m2} (ref >59)
GFR calc non Af Amer: 77 mL/min/{1.73_m2} (ref >59)
Glucose: 101 mg/dL — ABNORMAL HIGH (ref 65–99)
Potassium: 4.7 mmol/L (ref 3.5–5.2)
Sodium: 139 mmol/L (ref 134–144)

## 2019-05-19 LAB — MAGNESIUM: Magnesium: 2 mg/dL (ref 1.6–2.3)

## 2019-05-19 NOTE — Progress Notes (Signed)
Patient ID: Jessica [name]                 DOB: Nov 30, 1960                      MRN: [id number]     HPI: Jessica [name] is a 59 y.o. female referred by Dr. Tamala Julian to HTN clinic. PMH is significant for DM, HTN, chest discomfort.  Work-up 3 years ago demonstrated severe blood pressure elevation with exercise.  Intensification of antihypertensive therapy led to resolution.  More recently her blood pressure medications have been changed around by her primary physician. She is not having discomfort at rest.  She became concerned when the chest discomfort chronic and recurrent with exercise.  She has subsequently stopped exercising.   Patient seen in HTN clinic on 05/05/2018. Blood pressure was elevated, therefore losartan/HCTZ was increased from 50/12.5mg  to 100/25mg  daily.    Patient presents today for follow up. She states that he AM readings are still a little high but go down in the afternoon. Her systolic readings are pretty consistently at goal, but her diastolic is still elevated at times. She takes at 2AM when she wakes up and takes her pain medications.  Her home BP cuff was checked previously in the office. It was thought that her diastolic on her home BP cuff was a little higher ~10 points than clinic reading.  States she is dizziness, lightheadedness (litte when she stands up). Denies headache, blurred vision. States she gets SOB with exercise. Has swelling in her legs in the PM. None when she wakes up. She is trying to go to bed earlier. States she has lack of appetitie and feels more fatigued and winded when she walks/exercises. States the chest tightness she use to get while exercising is gone, but she still gets winded. This frustrates her.   Current HTN meds: losartan 100/25mg  daily Previously tried: amlodipine (edema with 5 and 10mg ), Maxzide (changed to losartan/hctz) BP goal: <130/80  Family History: The patient's family history includes CAD in her father; Colon polyps in her mother  and sister; Diabetes in her maternal grandfather, maternal grandmother, mother, paternal grandfather, sister, and sister; Heart disease in her mother; Heart failure in her father; Hyperlipidemia in her mother; Hypertension in her maternal grandmother; Stroke in her paternal grandmother.  Social History: no tobacco, 3-4 oz of red wine in the evening  Diet:   Exercise: use to walk 4 miles and do body pump. Has not done much since seeing dr. Tamala Julian due to chest tightness  Home BP readings: 98/83, 98/67, 110/79, 116/85, 115/82, 108/78, 107/76, 111/79, 115/82, 111/75, 98/67, 94/72, 141/95, 120/97, 118/92, 114/85, 115/84 HR 58-80 (mainly in the 60's)  Wt Readings from Last 3 Encounters:  04/17/19 236 lb 12.8 oz (107.4 kg)  02/20/16 241 lb (109.3 kg)  06/01/13 250 lb (113.4 kg)   BP Readings from Last 3 Encounters:  05/05/19 132/82  04/17/19 128/86  04/08/19 (!) 142/87   Pulse Readings from Last 3 Encounters:  05/05/19 64  04/17/19 77  04/08/19 61    Renal function: CrCl cannot be calculated (Unknown ideal weight.).  Past Medical History:  Diagnosis Date  . Anxiety   . Chest pain   . Chronic pain syndrome   . DDD (degenerative disc disease), lumbar   . Diabetes mellitus without complication ([place])   . Edema   . Family history of colonic polyps   . Fibroids   . Fibromyalgia   .  GERD without esophagitis   . Hypertension   . Hypertensive retinopathy of both eyes   . Hypoglycemia   . Lichen planus   . Lichen planus   . Localized edema   . Morbid obesity due to excess calories ([place])   . Osteoarthritis   . Other constipation   . Overactive bladder   . Palpitations   . Right-sided abdominal pain of unknown cause   . RLQ abdominal pain   . RUQ abdominal pain   . Thyroid cyst   . Type 2 diabetes mellitus without complication, without long-term current use of insulin ([place])   . Varicose veins of both lower extremities with pain   . Vertigo   . Vitamin D deficiency      Current Outpatient Medications on File Prior to Visit  Medication Sig Dispense Refill  . acetaminophen (TYLENOL) 500 MG tablet Take 1,000 mg by mouth every 6 (six) hours as needed for mild pain.    . ALL DAY RELIEF 220 MG tablet SMARTSIG:1 Tablet(s) By Mouth As Needed    . Cholecalciferol (VITAMIN D3) 5000 units CAPS Take 5,000 Units by mouth daily.    . Chromium 500 MCG TABS Take 500 mcg by mouth daily.    . cyclobenzaprine (FLEXERIL) 10 MG tablet Take 10 mg by mouth at bedtime as needed. For muscle spasms    . diazepam (VALIUM) 5 MG tablet Take 5 mg by mouth every 6 (six) hours as needed. For anxiety    . diazePAM 5 MG/5ML SOLN 1 tablet PO 2 times a day    . diclofenac (VOLTAREN) 50 MG EC tablet     . diphenhydrAMINE (BENADRYL) 25 MG tablet Take 25 mg by mouth every 6 (six) hours as needed for allergies.    . hydrOXYzine (ATARAX/VISTARIL) 10 MG tablet TAKE 1 TABLET BY MOUTH ONCE DAILY AT BEDTIME AS NEEDED FOR ITCHING FOR 90 DAYS    . losartan-hydrochlorothiazide (HYZAAR) 100-25 MG tablet Take 1 tablet by mouth daily. 90 tablet 3  . MAGNESIUM PO Take 3 tablets by mouth daily.    . meclizine (ANTIVERT) 12.5 MG tablet Take 12.5 mg by mouth 3 (three) times daily as needed. dizziness    . methylPREDNISolone (MEDROL DOSEPAK) 4 MG TBPK tablet Take as directed 21 tablet 0  . oxybutynin (DITROPAN) 5 MG tablet TAKE 1 TABLET BY MOUTH TWICE DAILY AS NEEDED FOR URINARY FREQUENCY    . pantoprazole (PROTONIX) 40 MG injection 40 mg daily.    . pantoprazole (PROTONIX) 40 MG tablet     . PRESCRIPTION MEDICATION Inject 1 tablet into the muscle 3 (three) times a week. METHYLCOBALAMIN    . traMADol (ULTRAM) 50 MG tablet Take 50 mg by mouth every 6 (six) hours as needed. For pain     No current facility-administered medications on file prior to visit.    Allergies  Allergen Reactions  . Mobic [Meloxicam] Shortness Of Breath  . Codeine     Unknown  . Demerol [Meperidine]     Unknown  . Dilaudid  [Hydromorphone]   . Fentanyl     Unknown  . Griseofulvin Ultramicrosize [Griseofulvin]   . Lyrica [Pregabalin]   . Morphine And Related     Unknown  . Neurontin [Gabapentin]   . Percocet [Oxycodone-Acetaminophen]     Unknown  . Vicodin [Hydrocodone-Acetaminophen]     There were no vitals taken for this visit.   Assessment/Plan:  1. Hypertension - Blood pressure is just above goal of <130/80 in clinic. Her  home readings vary, with some at goal and some with elevated diastolic. I will have her bring her home cuff to next appointment to compare again. Will compare to [facility] reading and to office automatic cuff. Patient complains of swelling in her legs at night. Will change HCTZ to chlorthalidone, pending BMP results, for better BP lowering and diuretic effect. Patient also states she has cramping and takes Mg tablets. Will check a Mg today along with BMP. Will call patient tomorrow with final plan based off of labs. Will schedule her for follow up in 2 weeks. Advised that I will have to defer the reason for her SOB to Dr. Tamala Julian   Thank you  Ramond Dial, Pharm.D, BCPS, CPP [facility]  [id number], [place], [place] [address]  Phone: [phone]; Fax: [phone]

## 2019-05-19 NOTE — Patient Instructions (Signed)
It was nice to see you again!  We will call you tomorrow with your lab results and final plan.  If everything looks good, we will plan to STOP lostartan/HCTZ and START losartan 100mg  daily AND chlorthalidone 25mg  daily.  Try taking blood pressure medication at 8:00AM  Give [place] a call at [phone] with any questions or concerns

## 2019-05-19 NOTE — Progress Notes (Signed)
No specific recommendation about dyspnea on exertion. Continue safe exercise, attempting to improve conditioning.Safe is walking and other aerobic activity that allows causes increased HR and RR but does not prevent carrying on a conversation.

## 2019-05-20 ENCOUNTER — Telehealth: Payer: Self-pay | Admitting: Pharmacist

## 2019-05-20 MED ORDER — LOSARTAN POTASSIUM 100 MG PO TABS: 100.0000 mg | ORAL_TABLET | Freq: Every day | ORAL | 11 refills | Status: DC

## 2019-05-20 MED ORDER — CHLORTHALIDONE 25 MG PO TABS: 25.0000 mg | ORAL_TABLET | Freq: Every day | ORAL | 11 refills | Status: DC

## 2019-05-20 NOTE — Telephone Encounter (Signed)
Spoke with pt about lab results. She will stop her losartan-HCTZ and start losartan 100mg  daily and chlorthalidone 25mg  daily. Scheduled f/u in clinic in 2 weeks. Also relayed message to pt from Dr Tamala Julian regarding her dyspnea on exertion: "Continue safe exercise, attempting to improve conditioning.Safe is walking and other aerobic activity that allows causes increased HR and RR but does not prevent carrying on a conversation."  Pt verbalized understanding and agreement with treatment plan.

## 2019-06-04 ENCOUNTER — Ambulatory Visit (INDEPENDENT_AMBULATORY_CARE_PROVIDER_SITE_OTHER): Payer: Medicare Other | Admitting: Pharmacist

## 2019-06-04 ENCOUNTER — Other Ambulatory Visit: Payer: Self-pay

## 2019-06-04 VITALS — BP 122/66 | HR 66

## 2019-06-04 DIAGNOSIS — I10 Essential (primary) hypertension: Secondary | ICD-10-CM | POA: Diagnosis not present

## 2019-06-04 NOTE — Patient Instructions (Addendum)
It was great seeing you today!  Your blood pressure goal is less than 130/80  Cut your chlorthalidone 25mg  in half and take 1/2 tablet (12.5mg ) once daily in the morning Continue your losartan 100mg  once daily  Continue monitoring your sodium intake and exercise as tolerated.  Purchase an adult large cuff and remember to put the cuff tight around bicep    Please call us with any questions!   Phone: [phone]

## 2019-06-04 NOTE — Progress Notes (Signed)
Patient ID: Jessica [name]                 DOB: 16-Dec-1960                      MRN: [id number]     HPI: Jessica [name] is a 59 y.o. female referred by Dr. Tamala Julian to HTN clinic. PMH is significant for HTN, DM, chest pain.  Seen by Dr. Tamala Julian on 3/19 and was started on losartan/hctz 50/40m daily in addition to her amlodipine.  Her amlodipine was subsequently stopped due to swelling at both 162mand 5 mg.  At her first visit to HTN clinic her losartan/hctz was increased to 100/2536m   Patient was seen at HTN clinic on 4/20 and blood pressure still above goal of 130/80, however some home readings were at goal and patient also complained of leg cramps. BMP + magnesium was ordered and when results came back WNL, HCTZ was switched to chlorthalidone.  Lab work (05/19/19): K 4.7, Creatinine 0.84, eGFR 89, Magnesium 2.0  Patient presents today with blood pressure log as well as her home machine to compare with clinic BP cuffs. Home logs reveal patient is testing multiple times a day starting upon awakening around 7:00am. Takes her losartan and chlorthalidone around 8:00am.  Blood pressure in office today using manual BP cuff 122/66 and 122/68.  Using electronic BP cuff 126/71.  Using patient's BP monitor: 128/86.  Blood pressure upon awakening in past week: 5/1: 141/89 5/2: 114/90 5/3: 117/89 5/4: 110/82 5/5: 125/88 5/6: 121/87  Reports that around 1 hour after taking her medication she starts to feel poorly.  Reports dizziness and sometimes has to lay down.  Her blood pressure drops during the late morning and around 12:00-13:00 reaches its lowest point.  Blood pressure during midday in past week 4/30: 104/84 at 11:15 5/1: 93/74 at 13:34 5/2: 110/82 at 10:17 5/3: 98/74 at 13:04 5/4: 94/82 at 10:59 5/5 (did not measure)  Blood pressure then increases in the afternoon and then is most elevated at night.  Evening blood pressure during past week: 5/1: 123/82 at 22:20 5/2: 120/76 at  20:59 5/3: 124/84 at 22:57 5/4: 113/73 at 23:35 5/5: 114/73 at 22:34  Patient also noted that her BP machine picked up rapid heart rate and occasional arrhythmias but upon recheck a few moments later had returned to normal.  Patient reported no symptoms.  Current HTN meds: chlorthalidone 25 mg daily, losartan 100 mg  Previously tried: amlodipine 5 mg, amlodipine 10 mg, losartan/hctz 50/12.5, losartan/hctz 100/25, triamterene/hctz 75/50 mg BP goal: <130/80  Family History: The patient'sfamily history includes CAD in her father; Colon polyps in her mother and sister; Diabetes in her maternal grandfather, maternal grandmother, mother, paternal grandfather, sister, and sister; Heart disease in her mother; Heart failure in her father; Hyperlipidemia in her mother; Hypertension in her maternal grandmother; Stroke in her paternal grandmother.  Social History: no tobacco, 3-4 oz of red wine in the evening  Diet: Is monitoring food and meals using phone app.  Scans labels for nutrition facts and sodium content.  Is trying to significantly reduce her sodium.  Reports one week consumed 7700 mg of sodium over 7 days.   Exercise: Is trying to increase exercise by walking 3 days a week for an hour up to 2 miles.  Wants to increase distance to 4 miles.    Wt Readings from Last 3 Encounters:  04/17/19 236 lb 12.8 oz (107.4 kg)  02/20/16 241 lb (109.3 kg)  06/01/13 250 lb (113.4 kg)   BP Readings from Last 3 Encounters:  05/19/19 128/80  05/05/19 132/82  04/17/19 128/86   Pulse Readings from Last 3 Encounters:  05/19/19 (!) 58  05/05/19 64  04/17/19 77    Renal function: CrCl cannot be calculated (Unknown ideal weight.).  Past Medical History:  Diagnosis Date  . Anxiety   . Chest pain   . Chronic pain syndrome   . DDD (degenerative disc disease), lumbar   . Diabetes mellitus without complication ([place])   . Edema   . Family history of colonic polyps   . Fibroids   . Fibromyalgia   .  GERD without esophagitis   . Hypertension   . Hypertensive retinopathy of both eyes   . Hypoglycemia   . Lichen planus   . Lichen planus   . Localized edema   . Morbid obesity due to excess calories ([place])   . Osteoarthritis   . Other constipation   . Overactive bladder   . Palpitations   . Right-sided abdominal pain of unknown cause   . RLQ abdominal pain   . RUQ abdominal pain   . Thyroid cyst   . Type 2 diabetes mellitus without complication, without long-term current use of insulin ([place])   . Varicose veins of both lower extremities with pain   . Vertigo   . Vitamin D deficiency     Current Outpatient Medications on File Prior to Visit  Medication Sig Dispense Refill  . acetaminophen (TYLENOL) 500 MG tablet Take 1,000 mg by mouth every 6 (six) hours as needed for mild pain.    . ALL DAY RELIEF 220 MG tablet SMARTSIG:1 Tablet(s) By Mouth As Needed    . chlorthalidone (HYGROTON) 25 MG tablet Take 1 tablet (25 mg total) by mouth daily. 30 tablet 11  . Cholecalciferol (VITAMIN D3) 5000 units CAPS Take 5,000 Units by mouth daily.    . Chromium 500 MCG TABS Take 500 mcg by mouth daily.    . cyclobenzaprine (FLEXERIL) 10 MG tablet Take 10 mg by mouth at bedtime as needed. For muscle spasms    . diazepam (VALIUM) 5 MG tablet Take 5 mg by mouth every 6 (six) hours as needed. For anxiety    . diazePAM 5 MG/5ML SOLN 1 tablet PO 2 times a day    . diclofenac (VOLTAREN) 50 MG EC tablet     . diphenhydrAMINE (BENADRYL) 25 MG tablet Take 25 mg by mouth every 6 (six) hours as needed for allergies.    . hydrOXYzine (ATARAX/VISTARIL) 10 MG tablet TAKE 1 TABLET BY MOUTH ONCE DAILY AT BEDTIME AS NEEDED FOR ITCHING FOR 90 DAYS    . losartan (COZAAR) 100 MG tablet Take 1 tablet (100 mg total) by mouth daily. 30 tablet 11  . MAGNESIUM PO Take 3 tablets by mouth daily.    . meclizine (ANTIVERT) 12.5 MG tablet Take 12.5 mg by mouth 3 (three) times daily as needed. dizziness    . methylPREDNISolone  (MEDROL DOSEPAK) 4 MG TBPK tablet Take as directed 21 tablet 0  . oxybutynin (DITROPAN) 5 MG tablet TAKE 1 TABLET BY MOUTH TWICE DAILY AS NEEDED FOR URINARY FREQUENCY    . pantoprazole (PROTONIX) 40 MG injection 40 mg daily.    . pantoprazole (PROTONIX) 40 MG tablet     . PRESCRIPTION MEDICATION Inject 1 tablet into the muscle 3 (three) times a week. METHYLCOBALAMIN    . traMADol (ULTRAM) 50 MG  tablet Take 50 mg by mouth every 6 (six) hours as needed. For pain     No current facility-administered medications on file prior to visit.    Allergies  Allergen Reactions  . Mobic [Meloxicam] Shortness Of Breath  . Codeine     Unknown  . Demerol [Meperidine]     Unknown  . Dilaudid [Hydromorphone]   . Fentanyl     Unknown  . Griseofulvin Ultramicrosize [Griseofulvin]   . Lyrica [Pregabalin]   . Morphine And Related     Unknown  . Neurontin [Gabapentin]   . Percocet [Oxycodone-Acetaminophen]     Unknown  . Vicodin [Hydrocodone-Acetaminophen]      Assessment/Plan:  1. Hypertension - Patients in office BP readings today 122/66, 126/71, and 122/68 are at goal of <130/80.  Patient's home BP cuff reading of 128/86 is above goal.  Measured patients arm and technique and recommended patient purchase an adult large BP cuff and position cuff tightly on her left bicep.  Since patient reported feeling dizzy after taking her medications and systolic blood pressure readings drop below 100 in midday, recommend patient reduce chlorthalidone to 12.5 mg once a day in the morning and continue monitoring BP.  Ordered BMP to check electrolytes and kidney function since starting chlorthalidone.  Recommended patient continue to monitor sodium intake and exercise as tolerated.  Will recheck in 2 weeks to assess BP before patient's cardiology appointment in 1 month.  Karren Cobble, PharmD, BCACP, [facility] [address] [place], [place] [address] Phone: [phone]; Fax: [phone] 06/04/2019 1:16 PM

## 2019-06-05 LAB — BASIC METABOLIC PANEL
BUN/Creatinine Ratio: 17 (ref 9–23)
BUN: 15 mg/dL (ref 6–24)
CO2: 28 mmol/L (ref 20–29)
Calcium: 9.3 mg/dL (ref 8.7–10.2)
Chloride: 99 mmol/L (ref 96–106)
Creatinine, Ser: 0.88 mg/dL (ref 0.57–1.00)
GFR calc Af Amer: 84 mL/min/{1.73_m2} (ref >59)
GFR calc non Af Amer: 73 mL/min/{1.73_m2} (ref >59)
Glucose: 97 mg/dL (ref 65–99)
Potassium: 3.6 mmol/L (ref 3.5–5.2)
Sodium: 143 mmol/L (ref 134–144)

## 2019-06-18 ENCOUNTER — Other Ambulatory Visit: Payer: Self-pay

## 2019-06-18 ENCOUNTER — Ambulatory Visit (INDEPENDENT_AMBULATORY_CARE_PROVIDER_SITE_OTHER): Payer: Medicare Other | Admitting: Pharmacist

## 2019-06-18 VITALS — BP 116/68 | HR 60

## 2019-06-18 DIAGNOSIS — I10 Essential (primary) hypertension: Secondary | ICD-10-CM

## 2019-06-18 NOTE — Patient Instructions (Addendum)
Start taking losartan at night. You can take 1/2 tablet tonight and then start tomorrow taking a while tablet in the evening.  You may increase chlorthalidone to 25mg  daily.  Make an appointment with your PCP to discuss pain in thigh and fatigue.  Call us at [phone] with any questions or concerns

## 2019-06-18 NOTE — Progress Notes (Signed)
Patient ID: Jessica [name]                 DOB: Dec 05, 1960                      MRN: [id number]     HPI: Jessica [name] is a 59 y.o. female referred by Dr. Tamala Julian to HTN clinic. PMH is significant for HTN, DM, chest pain.  Seen by Dr. Tamala Julian on 3/19 and was started on losartan/hctz 50/51m daily in addition to her amlodipine.  Her amlodipine was subsequently stopped due to swelling at both 148mand 5 mg.  At her first visit to HTN clinic her losartan/hctz was increased to 100/2513m   Patient was seen at HTN clinic on 5/6 and blood pressure at goal in clinic (122/66). However some home readings were above goal in the AM. Her pressures in the afternoon dropped into the low [id number] 90'03'Kstolic. She also reported feeling poorly about 1 hr after taking her AM meds. Her chlorthalidone was decreased to 12.5mg24mily. Home cuff was found to be about 8 points higher diastolic. I measured her arm and she needed an adult large blood pressure cuff. Patient was going to get a new meter.   Lab work (06/04/19): K 3.6, Creatinine 0.88, eGFR 84  Patient presents today for follow up. She brings in a log of her blood pressures. She did get a new machine with an adult large cuff. Blood pressures are mostly at goal. Most of the ones above goal are in the AM before meds. Compared home cuff to clinic cuff. Systolic was accurate. Diastolic about 8 points higher on home cuff. She complains of feeling tired all the time. She use to be able to go to the gym and get a good workout and have energy for a few hours after. Now she has no energy. Napping during the day. She also complains of a tighntess in her thigh that hurts. Denies dizziness, lightheadedness. Wants to increase chlorthalidone back to 25mg56m to increased swelling. Clinic =116/68 vs home 119/76  Current HTN meds: chlorthalidone 12.5 mg daily, losartan 100 mg  Previously tried: amlodipine 5 mg, amlodipine 10 mg, losartan/hctz 50/12.5, losartan/hctz 100/25,  triamterene/hctz 75/50 mg BP goal: <130/80  Family History: The patient'sfamily history includes CAD in her father; Colon polyps in her mother and sister; Diabetes in her maternal grandfather, maternal grandmother, mother, paternal grandfather, sister, and sister; Heart disease in her mother; Heart failure in her father; Hyperlipidemia in her mother; Hypertension in her maternal grandmother; Stroke in her paternal grandmother.  Social History: no tobacco, 3-4 oz of red wine in the evening  Diet: Is monitoring food and meals using phone app.  Scans labels for nutrition facts and sodium content.  Is trying to significantly reduce her sodium.  Reports one week consumed 7700 mg of sodium over 7 days.   Exercise: Is trying to increase exercise by walking 3 days a week for an hour up to 2 miles.  Wants to increase distance to 4 miles.    Home BP: 117/75, 116/74, 137/74, 128/90, 142/82, 132/83, 126/78, 113/73, 116/76, 113/77, 120/79, 123/81, 119/79, 109/73, 137/82, 127/84, 126/82, 131/70, 115/77, 108/76, 106/66, 113/75, 115/72, 133/81, 131/83, 128/83, 116/78, 113/74, 114/74, 113/76, 120/82, 134/83, 115/79, 138/83, 112/71, 116/76, 118/77 HR50-73  Wt Readings from Last 3 Encounters:  04/17/19 236 lb 12.8 oz (107.4 kg)  02/20/16 241 lb (109.3 kg)  06/01/13 250 lb (113.4 kg)   BP Readings  from Last 3 Encounters:  06/04/19 122/66  05/19/19 128/80  05/05/19 132/82   Pulse Readings from Last 3 Encounters:  06/04/19 66  05/19/19 (!) 58  05/05/19 64    Renal function: CrCl cannot be calculated (Unknown ideal weight.).  Past Medical History:  Diagnosis Date  . Anxiety   . Chest pain   . Chronic pain syndrome   . DDD (degenerative disc disease), lumbar   . Diabetes mellitus without complication ([place])   . Edema   . Family history of colonic polyps   . Fibroids   . Fibromyalgia   . GERD without esophagitis   . Hypertension   . Hypertensive retinopathy of both eyes   . Hypoglycemia   .  Lichen planus   . Lichen planus   . Localized edema   . Morbid obesity due to excess calories ([place])   . Osteoarthritis   . Other constipation   . Overactive bladder   . Palpitations   . Right-sided abdominal pain of unknown cause   . RLQ abdominal pain   . RUQ abdominal pain   . Thyroid cyst   . Type 2 diabetes mellitus without complication, without long-term current use of insulin ([place])   . Varicose veins of both lower extremities with pain   . Vertigo   . Vitamin D deficiency     Current Outpatient Medications on File Prior to Visit  Medication Sig Dispense Refill  . acetaminophen (TYLENOL) 500 MG tablet Take 1,000 mg by mouth every 6 (six) hours as needed for mild pain.    . ALL DAY RELIEF 220 MG tablet SMARTSIG:1 Tablet(s) By Mouth As Needed    . chlorthalidone (HYGROTON) 25 MG tablet Take 0.5 tablets (12.5 mg total) by mouth daily. 30 tablet 11  . Cholecalciferol (VITAMIN D3) 5000 units CAPS Take 5,000 Units by mouth daily.    . Chromium 500 MCG TABS Take 500 mcg by mouth daily.    . cyclobenzaprine (FLEXERIL) 10 MG tablet Take 10 mg by mouth at bedtime as needed. For muscle spasms    . diazepam (VALIUM) 5 MG tablet Take 5 mg by mouth every 6 (six) hours as needed. For anxiety    . diazePAM 5 MG/5ML SOLN 1 tablet PO 2 times a day    . diclofenac (VOLTAREN) 50 MG EC tablet     . diphenhydrAMINE (BENADRYL) 25 MG tablet Take 25 mg by mouth every 6 (six) hours as needed for allergies.    . hydrOXYzine (ATARAX/VISTARIL) 10 MG tablet TAKE 1 TABLET BY MOUTH ONCE DAILY AT BEDTIME AS NEEDED FOR ITCHING FOR 90 DAYS    . losartan (COZAAR) 100 MG tablet Take 1 tablet (100 mg total) by mouth daily. 30 tablet 11  . MAGNESIUM PO Take 3 tablets by mouth daily.    . meclizine (ANTIVERT) 12.5 MG tablet Take 12.5 mg by mouth 3 (three) times daily as needed. dizziness    . methylPREDNISolone (MEDROL DOSEPAK) 4 MG TBPK tablet Take as directed 21 tablet 0  . oxybutynin (DITROPAN) 5 MG tablet TAKE 1  TABLET BY MOUTH TWICE DAILY AS NEEDED FOR URINARY FREQUENCY    . pantoprazole (PROTONIX) 40 MG injection 40 mg daily.    . pantoprazole (PROTONIX) 40 MG tablet     . PRESCRIPTION MEDICATION Inject 1 tablet into the muscle 3 (three) times a week. METHYLCOBALAMIN    . traMADol (ULTRAM) 50 MG tablet Take 50 mg by mouth every 6 (six) hours as needed. For pain  No current facility-administered medications on file prior to visit.    Allergies  Allergen Reactions  . Mobic [Meloxicam] Shortness Of Breath  . Codeine     Unknown  . Demerol [Meperidine]     Unknown  . Dilaudid [Hydromorphone]   . Fentanyl     Unknown  . Griseofulvin Ultramicrosize [Griseofulvin]   . Lyrica [Pregabalin]   . Morphine And Related     Unknown  . Neurontin [Gabapentin]   . Percocet [Oxycodone-Acetaminophen]     Unknown  . Vicodin [Hydrocodone-Acetaminophen]      Assessment/Plan:  1. Hypertension - Blood pressure is at goal of <130/80 in clinic today. Patient desires to increase chlorthalidone back to 23m daily. I think there is enough room to increase. Will switch losartan to dosing in the evening per pt request. I do not think her fatigue or muscle cramp (tightness) is coming from medication. She is not taking her sedating medications (ie flexeril, benzos) and has been functioning normally on tramadol for quite some time. Electrolytes have been stable. I have requested she speak with her PCP about these issues. Advised patient she only needs to check her blood pressure once a day. Suggested she try sitting on a ball (like a lacross ball or softball) and rolling around until she finds a tight (painful) spot and staying there for as long as she can tolerate to help loosen up her hips and back. She has an appointment with Dr. STamala Julianin a few weeks. Will have this to be follow up. Patient to follow up in HTN prn. I will call patient in 1 month to see how she is doing.  MRamond Dial Pharm.D, BCPS, CPP [place] [address] [place] [address] Phone: [phone] Fax: [phone] 06/18/2019 7:28 AM

## 2019-06-30 NOTE — Progress Notes (Signed)
Cardiology Office Note:    Date:  07/01/2019   ID:  Jessica [name], DOB 10/18/1960, MRN [id number]  PCP:  Shirline Frees, MD  Cardiologist:  Sinclair Grooms, MD   Referring MD: Shirline Frees, MD   Chief Complaint  Patient presents with  . Hypertension  . Hyperlipidemia    History of Present Illness:    Jessica [name] is a 59 y.o. female with a family h/o CAD, and personal h/o prediabetes, hyperlipidemia, palpitations, and hypertension who has begun having exertional chest tightness. Multiple med side effects-nonspecific but had ankle edema with amlodipine.  She is asymptomatic.  She is compliant with her medication regimen.  She is not exercising as much as she should be.  Past Medical History:  Diagnosis Date  . Anxiety   . Chest pain   . Chronic pain syndrome   . DDD (degenerative disc disease), lumbar   . Diabetes mellitus without complication ([place])   . Edema   . Family history of colonic polyps   . Fibroids   . Fibromyalgia   . GERD without esophagitis   . Hypertension   . Hypertensive retinopathy of both eyes   . Hypoglycemia   . Lichen planus   . Lichen planus   . Localized edema   . Morbid obesity due to excess calories ([place])   . Osteoarthritis   . Other constipation   . Overactive bladder   . Palpitations   . Right-sided abdominal pain of unknown cause   . RLQ abdominal pain   . RUQ abdominal pain   . Thyroid cyst   . Type 2 diabetes mellitus without complication, without long-term current use of insulin ([place])   . Varicose veins of both lower extremities with pain   . Vertigo   . Vitamin D deficiency     Past Surgical History:  Procedure Laterality Date  . arthroscopic knee    . CESAREAN SECTION    . HYSTEROSCOPY      Current Medications: Current Meds  Medication Sig  . acetaminophen (TYLENOL) 500 MG tablet Take 1,000 mg by mouth every 6 (six) hours as needed for mild pain.  . chlorthalidone (HYGROTON) 25 MG tablet Take 25 mg by mouth  daily.  . Cholecalciferol (VITAMIN D3) 5000 units CAPS Take 5,000 Units by mouth daily.  . cyclobenzaprine (FLEXERIL) 10 MG tablet Take 10 mg by mouth at bedtime as needed. For muscle spasms  . diazepam (VALIUM) 5 MG tablet Take 5 mg by mouth every 6 (six) hours as needed. For anxiety  . hydrOXYzine (ATARAX/VISTARIL) 10 MG tablet TAKE 1 TABLET BY MOUTH ONCE DAILY AT BEDTIME AS NEEDED FOR ITCHING FOR 90 DAYS  . losartan (COZAAR) 100 MG tablet Take 1 tablet (100 mg total) by mouth daily.  Marland Kitchen MAGNESIUM PO Take 3 tablets by mouth daily.  . meclizine (ANTIVERT) 12.5 MG tablet Take 12.5 mg by mouth 3 (three) times daily as needed. dizziness  . oxybutynin (DITROPAN) 5 MG tablet TAKE 1 TABLET BY MOUTH TWICE DAILY AS NEEDED FOR URINARY FREQUENCY  . pantoprazole (PROTONIX) 40 MG tablet Take 40 mg by mouth daily.   Marland Kitchen PRESCRIPTION MEDICATION Take 1 tablet by mouth 3 (three) times a week. METHYLCOBALAMIN  . traMADol (ULTRAM) 50 MG tablet Take 50 mg by mouth every 6 (six) hours as needed. For pain     Allergies:   Mobic [meloxicam], Codeine, Demerol [meperidine], Dilaudid [hydromorphone], Fentanyl, Griseofulvin ultramicrosize [griseofulvin], Lyrica [pregabalin], Morphine and related, Neurontin [gabapentin], Percocet [oxycodone-acetaminophen],  and Vicodin [hydrocodone-acetaminophen]   Social History   Socioeconomic History  . Marital status: Married    Spouse name: Not on file  . Number of children: Not on file  . Years of education: Not on file  . Highest education level: Not on file  Occupational History  . Not on file  Tobacco Use  . Smoking status: Never Smoker  . Smokeless tobacco: Never Used  Substance and Sexual Activity  . Alcohol use: No  . Drug use: No  . Sexual activity: Not on file  Other Topics Concern  . Not on file  Social History Narrative  . Not on file   Social Determinants of Health   Financial Resource Strain:   . Difficulty of Paying Living Expenses:   Food Insecurity:    . Worried About Charity fundraiser in the Last Year:   . Arboriculturist in the Last Year:   Transportation Needs:   . Film/video editor (Medical):   Marland Kitchen Lack of Transportation (Non-Medical):   Physical Activity:   . Days of Exercise per Week:   . Minutes of Exercise per Session:   Stress:   . Feeling of Stress :   Social Connections:   . Frequency of Communication with Friends and Family:   . Frequency of Social Gatherings with Friends and Family:   . Attends Religious Services:   . Active Member of Clubs or Organizations:   . Attends Archivist Meetings:   Marland Kitchen Marital Status:      Family History: The patient's family history includes CAD in her father; Colon polyps in her mother and sister; Diabetes in her maternal grandfather, maternal grandmother, mother, paternal grandfather, sister, and sister; Heart disease in her mother; Heart failure in her father; Hyperlipidemia in her mother; Hypertension in her maternal grandmother; Stroke in her paternal grandmother.  ROS:   Please see the history of present illness.    Overweight.  Not exercising as much as she would like, understands that she has prediabetes and needs to lose weight.  She sleeps well.  All other systems reviewed and are negative.  EKGs/Labs/Other Studies Reviewed:    The following studies were reviewed today: No new data  EKG:  EKG not repeated  Recent Labs: 05/19/2019: Magnesium 2.0 06/04/2019: BUN 15; Creatinine, Ser 0.88; Potassium 3.6; Sodium 143  Recent Lipid Panel No results found for: CHOL, TRIG, HDL, CHOLHDL, VLDL, LDLCALC, LDLDIRECT  Physical Exam:    VS:  BP 126/68   Pulse 63   Ht 5\' 3"  (1.6 m)   Wt 235 lb (106.6 kg)   SpO2 97%   BMI 41.63 kg/m     Wt Readings from Last 3 Encounters:  07/01/19 235 lb (106.6 kg)  04/17/19 236 lb 12.8 oz (107.4 kg)  02/20/16 241 lb (109.3 kg)     GEN: Obese. No acute distress HEENT: Normal NECK: No JVD. LYMPHATICS: No  lymphadenopathy CARDIAC:  RRR without murmur, gallop, or edema. VASCULAR:  Normal Pulses. No bruits. RESPIRATORY:  Clear to auscultation without rales, wheezing or rhonchi  ABDOMEN: Soft, non-tender, non-distended, No pulsatile mass, MUSCULOSKELETAL: No deformity  SKIN: Warm and dry NEUROLOGIC:  Alert and oriented x 3 PSYCHIATRIC:  Normal affect   ASSESSMENT:    1. Essential hypertension   2. Palpitations   3. Controlled type 2 diabetes mellitus with complication, without long-term current use of insulin ([place])   4. Educated about COVID-19 virus infection    PLAN:  In order of problems listed above:  1. Blood pressure is much better controlled has noted today.  Low-salt diet and exercise encouraged.  Continue Migratine 25 mg/day Cozaar 100 mg/day. 2. No complaints 3. A1c 6.2.  Discussed diet including more vegetables and less carbohydrates.  Moderate activity also encouraged. 4. She has been vaccinated.  Continue current therapy.  Return in 1 year for risk factor modification coaching.   Medication Adjustments/Labs and Tests Ordered: Current medicines are reviewed at length with the patient today.  Concerns regarding medicines are outlined above.  No orders of the defined types were placed in this encounter.  No orders of the defined types were placed in this encounter.   Patient Instructions  Medication Instructions:  Your physician recommends that you continue on your current medications as directed. Please refer to the Current Medication list given to you today.  *If you need a refill on your cardiac medications before your next appointment, please call your pharmacy*   Lab Work: None If you have labs (blood work) drawn today and your tests are completely normal, you will receive your results only by: Marland Kitchen MyChart Message (if you have MyChart) OR . A paper copy in the mail If you have any lab test that is abnormal or we need to change your treatment, we will call you  to review the results.   Testing/Procedures: None   Follow-Up: At [facility], you and your health needs are our priority.  As part of our continuing mission to provide you with exceptional heart care, we have created designated Provider Care Teams.  These Care Teams include your primary Cardiologist (physician) and Advanced Practice Providers (APPs -  Physician Assistants and Nurse Practitioners) who all work together to provide you with the care you need, when you need it.  We recommend signing up for the patient portal called "MyChart".  Sign up information is provided on this After Visit Summary.  MyChart is used to connect with patients for Virtual Visits (Telemedicine).  Patients are able to view lab/test results, encounter notes, upcoming appointments, etc.  Non-urgent messages can be sent to your provider as well.   To learn more about what you can do with MyChart, go to [email or web address].    Your next appointment:   12 month(s)  The format for your next appointment:   In Person  Provider:   You may see Sinclair Grooms, MD or one of the following Advanced Practice Providers on your designated Care Team:    Truitt Merle, NP  Cecilie Kicks, NP  Kathyrn Drown, NP    Other Instructions  Your provider recommends that you maintain 150 minutes per week of moderate aerobic activity.   Fat and Cholesterol Restricted Eating Plan Getting too much fat and cholesterol in your diet may cause health problems. Choosing the right foods helps keep your fat and cholesterol at normal levels. This can keep you from getting certain diseases.   What are tips for following this plan? Meal planning  At meals, divide your plate into four equal parts: ? Fill one-half of your plate with vegetables and green salads. ? Fill one-fourth of your plate with whole grains. ? Fill one-fourth of your plate with low-fat (lean) protein foods.  Eat fish that is high in omega-3 fats at least  two times a week. This includes mackerel, tuna, sardines, and salmon.  Eat foods that are high in fiber, such as whole grains, beans, apples, broccoli, carrots, peas, and barley. General tips  Work with your doctor to lose weight if you need to.  Avoid: ? Foods with added sugar. ? Fried foods. ? Foods with partially hydrogenated oils.  Limit alcohol intake to no more than 1 drink a day for nonpregnant women and 2 drinks a day for men. One drink equals 12 oz of beer, 5 oz of wine, or 1 oz of hard liquor. Reading food labels  Check food labels for: ? Trans fats. ? Partially hydrogenated oils. ? Saturated fat (g) in each serving. ? Cholesterol (mg) in each serving. ? Fiber (g) in each serving.  Choose foods with healthy fats, such as: ? Monounsaturated fats. ? Polyunsaturated fats. ? Omega-3 fats.  Choose grain products that have whole grains. Look for the word "whole" as the first word in the ingredient list. Cooking  Cook foods using low-fat methods. These include baking, boiling, grilling, and broiling.  Eat more home-cooked foods. Eat at restaurants and buffets less often.  Avoid cooking using saturated fats, such as butter, cream, palm oil, palm kernel oil, and coconut oil. Recommended foods  Fruits  All fresh, canned (in natural juice), or frozen fruits. Vegetables  Fresh or frozen vegetables (raw, steamed, roasted, or grilled). Green salads. Grains  Whole grains, such as whole wheat or whole grain breads, crackers, cereals, and pasta. Unsweetened oatmeal, bulgur, barley, quinoa, or brown rice. Corn or whole wheat flour tortillas. Meats and other protein foods  Ground beef (85% or leaner), grass-fed beef, or beef trimmed of fat. Skinless chicken or [place]. Ground chicken or [place]. Pork trimmed of fat. All fish and seafood. Egg whites. Dried beans, peas, or lentils. Unsalted nuts or seeds. Unsalted canned beans. Nut butters without added sugar or  oil. Dairy  Low-fat or nonfat dairy products, such as skim or 1% milk, 2% or reduced-fat cheeses, low-fat and fat-free ricotta or cottage cheese, or plain low-fat and nonfat yogurt. Fats and oils  Tub margarine without trans fats. Light or reduced-fat mayonnaise and salad dressings. Avocado. Olive, canola, sesame, or safflower oils. The items listed above may not be a complete list of foods and beverages you can eat. Contact a dietitian for more information. Foods to avoid Fruits  Canned fruit in heavy syrup. Fruit in cream or butter sauce. Fried fruit. Vegetables  Vegetables cooked in cheese, cream, or butter sauce. Fried vegetables. Grains  White bread. White pasta. White rice. Cornbread. Bagels, pastries, and croissants. Crackers and snack foods that contain trans fat and hydrogenated oils. Meats and other protein foods  Fatty cuts of meat. Ribs, chicken wings, bacon, sausage, bologna, salami, chitterlings, fatback, hot dogs, bratwurst, and packaged lunch meats. Liver and organ meats. Whole eggs and egg yolks. Chicken and [place] with skin. Fried meat. Dairy  Whole or 2% milk, cream, half-and-half, and cream cheese. Whole milk cheeses. Whole-fat or sweetened yogurt. Full-fat cheeses. Nondairy creamers and whipped toppings. Processed cheese, cheese spreads, and cheese curds. Beverages  Alcohol. Sugar-sweetened drinks such as sodas, lemonade, and fruit drinks. Fats and oils  Butter, stick margarine, lard, shortening, ghee, or bacon fat. Coconut, palm kernel, and palm oils. Sweets and desserts  Corn syrup, sugars, honey, and molasses. Candy. Jam and jelly. Syrup. Sweetened cereals. Cookies, pies, cakes, donuts, muffins, and ice cream. The items listed above may not be a complete list of foods and beverages you should avoid. Contact a dietitian for more information. Summary  Choosing the right foods helps keep your fat and cholesterol at normal levels. This can keep you from getting  certain diseases.  At meals, fill one-half of your plate with vegetables and green salads.  Eat high-fiber foods, like whole grains, beans, apples, carrots, peas, and barley.  Limit added sugar, saturated fats, alcohol, and fried foods. This information is not intended to replace advice given to you by your health care provider. Make sure you discuss any questions you have with your health care provider. Document Revised: 09/18/2017 Document Reviewed: 10/02/2016 Elsevier Patient Education  2020 [place], Sinclair Grooms, MD  07/01/2019 11:20 AM    [place]

## 2019-07-01 ENCOUNTER — Encounter: Payer: Self-pay | Admitting: Interventional Cardiology

## 2019-07-01 ENCOUNTER — Ambulatory Visit (INDEPENDENT_AMBULATORY_CARE_PROVIDER_SITE_OTHER): Payer: Medicare Other | Admitting: Interventional Cardiology

## 2019-07-01 ENCOUNTER — Other Ambulatory Visit: Payer: Self-pay

## 2019-07-01 VITALS — BP 126/68 | HR 63 | Ht 63.0 in | Wt 235.0 lb

## 2019-07-01 DIAGNOSIS — I10 Essential (primary) hypertension: Secondary | ICD-10-CM

## 2019-07-01 DIAGNOSIS — E118 Type 2 diabetes mellitus with unspecified complications: Secondary | ICD-10-CM | POA: Diagnosis not present

## 2019-07-01 DIAGNOSIS — R002 Palpitations: Secondary | ICD-10-CM | POA: Diagnosis not present

## 2019-07-01 DIAGNOSIS — Z7189 Other specified counseling: Secondary | ICD-10-CM | POA: Diagnosis not present

## 2019-07-01 NOTE — Patient Instructions (Signed)
Medication Instructions:  Your physician recommends that you continue on your current medications as directed. Please refer to the Current Medication list given to you today.  *If you need a refill on your cardiac medications before your next appointment, please call your pharmacy*   Lab Work: None If you have labs (blood work) drawn today and your tests are completely normal, you will receive your results only by: [place] MyChart Message (if you have MyChart) OR . A paper copy in the mail If you have any lab test that is abnormal or we need to change your treatment, we will call you to review the results.   Testing/Procedures: None   Follow-Up: At [facility], you and your health needs are our priority.  As part of our continuing mission to provide you with exceptional heart care, we have created designated Provider Care Teams.  These Care Teams include your primary Cardiologist (physician) and Advanced Practice Providers (APPs -  Physician Assistants and Nurse Practitioners) who all work together to provide you with the care you need, when you need it.  We recommend signing up for the patient portal called "MyChart".  Sign up information is provided on this After Visit Summary.  MyChart is used to connect with patients for Virtual Visits (Telemedicine).  Patients are able to view lab/test results, encounter notes, upcoming appointments, etc.  Non-urgent messages can be sent to your provider as well.   To learn more about what you can do with MyChart, go to [email or web address].    Your next appointment:   12 month(s)  The format for your next appointment:   In Person  Provider:   You may see Sinclair Grooms, MD or one of the following Advanced Practice Providers on your designated Care Team:    Truitt Merle, NP  Cecilie Kicks, NP  Kathyrn Drown, NP    Other Instructions  Your provider recommends that you maintain 150 minutes per week of moderate aerobic  activity.   Fat and Cholesterol Restricted Eating Plan Getting too much fat and cholesterol in your diet may cause health problems. Choosing the right foods helps keep your fat and cholesterol at normal levels. This can keep you from getting certain diseases.   What are tips for following this plan? Meal planning  At meals, divide your plate into four equal parts: ? Fill one-half of your plate with vegetables and green salads. ? Fill one-fourth of your plate with whole grains. ? Fill one-fourth of your plate with low-fat (lean) protein foods.  Eat fish that is high in omega-3 fats at least two times a week. This includes mackerel, tuna, sardines, and salmon.  Eat foods that are high in fiber, such as whole grains, beans, apples, broccoli, carrots, peas, and barley. General tips   Work with your doctor to lose weight if you need to.  Avoid: ? Foods with added sugar. ? Fried foods. ? Foods with partially hydrogenated oils.  Limit alcohol intake to no more than 1 drink a day for nonpregnant women and 2 drinks a day for men. One drink equals 12 oz of beer, 5 oz of wine, or 1 oz of hard liquor. Reading food labels  Check food labels for: ? Trans fats. ? Partially hydrogenated oils. ? Saturated fat (g) in each serving. ? Cholesterol (mg) in each serving. ? Fiber (g) in each serving.  Choose foods with healthy fats, such as: ? Monounsaturated fats. ? Polyunsaturated fats. ? Omega-3 fats.  Choose grain products that  have whole grains. Look for the word "whole" as the first word in the ingredient list. Cooking  Cook foods using low-fat methods. These include baking, boiling, grilling, and broiling.  Eat more home-cooked foods. Eat at restaurants and buffets less often.  Avoid cooking using saturated fats, such as butter, cream, palm oil, palm kernel oil, and coconut oil. Recommended foods  Fruits  All fresh, canned (in natural juice), or frozen  fruits. Vegetables  Fresh or frozen vegetables (raw, steamed, roasted, or grilled). Green salads. Grains  Whole grains, such as whole wheat or whole grain breads, crackers, cereals, and pasta. Unsweetened oatmeal, bulgur, barley, quinoa, or brown rice. Corn or whole wheat flour tortillas. Meats and other protein foods  Ground beef (85% or leaner), grass-fed beef, or beef trimmed of fat. Skinless chicken or [place]. Ground chicken or [place]. Pork trimmed of fat. All fish and seafood. Egg whites. Dried beans, peas, or lentils. Unsalted nuts or seeds. Unsalted canned beans. Nut butters without added sugar or oil. Dairy  Low-fat or nonfat dairy products, such as skim or 1% milk, 2% or reduced-fat cheeses, low-fat and fat-free ricotta or cottage cheese, or plain low-fat and nonfat yogurt. Fats and oils  Tub margarine without trans fats. Light or reduced-fat mayonnaise and salad dressings. Avocado. Olive, canola, sesame, or safflower oils. The items listed above may not be a complete list of foods and beverages you can eat. Contact a dietitian for more information. Foods to avoid Fruits  Canned fruit in heavy syrup. Fruit in cream or butter sauce. Fried fruit. Vegetables  Vegetables cooked in cheese, cream, or butter sauce. Fried vegetables. Grains  White bread. White pasta. White rice. Cornbread. Bagels, pastries, and croissants. Crackers and snack foods that contain trans fat and hydrogenated oils. Meats and other protein foods  Fatty cuts of meat. Ribs, chicken wings, bacon, sausage, bologna, salami, chitterlings, fatback, hot dogs, bratwurst, and packaged lunch meats. Liver and organ meats. Whole eggs and egg yolks. Chicken and [place] with skin. Fried meat. Dairy  Whole or 2% milk, cream, half-and-half, and cream cheese. Whole milk cheeses. Whole-fat or sweetened yogurt. Full-fat cheeses. Nondairy creamers and whipped toppings. Processed cheese, cheese spreads, and cheese  curds. Beverages  Alcohol. Sugar-sweetened drinks such as sodas, lemonade, and fruit drinks. Fats and oils  Butter, stick margarine, lard, shortening, ghee, or bacon fat. Coconut, palm kernel, and palm oils. Sweets and desserts  Corn syrup, sugars, honey, and molasses. Candy. Jam and jelly. Syrup. Sweetened cereals. Cookies, pies, cakes, donuts, muffins, and ice cream. The items listed above may not be a complete list of foods and beverages you should avoid. Contact a dietitian for more information. Summary  Choosing the right foods helps keep your fat and cholesterol at normal levels. This can keep you from getting certain diseases.  At meals, fill one-half of your plate with vegetables and green salads.  Eat high-fiber foods, like whole grains, beans, apples, carrots, peas, and barley.  Limit added sugar, saturated fats, alcohol, and fried foods. This information is not intended to replace advice given to you by your health care provider. Make sure you discuss any questions you have with your health care provider. Document Revised: 09/18/2017 Document Reviewed: 10/02/2016 Elsevier Patient Education  [place].

## 2019-09-21 ENCOUNTER — Other Ambulatory Visit: Payer: Self-pay | Admitting: Orthopedic Surgery

## 2019-09-21 DIAGNOSIS — M545 Low back pain, unspecified: Secondary | ICD-10-CM

## 2019-09-21 DIAGNOSIS — M5416 Radiculopathy, lumbar region: Secondary | ICD-10-CM

## 2019-10-14 ENCOUNTER — Ambulatory Visit
Admission: RE | Admit: 2019-10-14 | Discharge: 2019-10-14 | Disposition: A | Discharge status: Home / Self Care | Payer: Medicare Other | Source: Ambulatory Visit | Attending: Orthopedic Surgery | Admitting: Orthopedic Surgery

## 2019-10-14 DIAGNOSIS — M545 Low back pain, unspecified: Secondary | ICD-10-CM

## 2019-10-14 DIAGNOSIS — M5416 Radiculopathy, lumbar region: Secondary | ICD-10-CM

## 2020-03-11 DIAGNOSIS — M1811 Unilateral primary osteoarthritis of first carpometacarpal joint, right hand: Secondary | ICD-10-CM | POA: Diagnosis not present

## 2020-03-11 DIAGNOSIS — M19041 Primary osteoarthritis, right hand: Secondary | ICD-10-CM | POA: Diagnosis not present

## 2020-03-11 DIAGNOSIS — M79641 Pain in right hand: Secondary | ICD-10-CM | POA: Diagnosis not present

## 2020-03-11 DIAGNOSIS — M1990 Unspecified osteoarthritis, unspecified site: Secondary | ICD-10-CM | POA: Diagnosis not present

## 2020-04-18 DIAGNOSIS — Z20822 Contact with and (suspected) exposure to covid-19: Secondary | ICD-10-CM | POA: Diagnosis not present

## 2020-04-25 DIAGNOSIS — Z20822 Contact with and (suspected) exposure to covid-19: Secondary | ICD-10-CM | POA: Diagnosis not present

## 2020-05-04 DIAGNOSIS — I10 Essential (primary) hypertension: Secondary | ICD-10-CM | POA: Diagnosis not present

## 2020-05-04 DIAGNOSIS — M797 Fibromyalgia: Secondary | ICD-10-CM | POA: Diagnosis not present

## 2020-05-04 DIAGNOSIS — E1169 Type 2 diabetes mellitus with other specified complication: Secondary | ICD-10-CM | POA: Diagnosis not present

## 2020-05-04 DIAGNOSIS — R600 Localized edema: Secondary | ICD-10-CM | POA: Diagnosis not present

## 2020-05-04 DIAGNOSIS — E559 Vitamin D deficiency, unspecified: Secondary | ICD-10-CM | POA: Diagnosis not present

## 2020-08-04 DIAGNOSIS — Z20822 Contact with and (suspected) exposure to covid-19: Secondary | ICD-10-CM | POA: Diagnosis not present

## 2020-08-25 DIAGNOSIS — Z20822 Contact with and (suspected) exposure to covid-19: Secondary | ICD-10-CM | POA: Diagnosis not present

## 2020-09-05 NOTE — Progress Notes (Signed)
Cardiology Office Note:    Date:  09/07/2020   ID:  Jessica [name], DOB May 11, 1960, MRN [id number]  PCP:  Shirline Frees, MD  Cardiologist:  Sinclair Grooms, MD   Referring MD: Shirline Frees, MD   No chief complaint on file.   History of Present Illness:    Jessica [name] is a 60 y.o. female with a hx of with a family h/o CAD, and personal h/o prediabetes, hyperlipidemia, palpitations, and hypertension who has begun having exertional chest tightness. Multiple med side effects-nonspecific but had ankle edema with amlodipine.  She has not had recurrent chest discomfort.  Lower extremity edema has resolved.  She feels great.  Medications have been adjusted by her primary physician, Dr. Shirline Frees.  She is currently taking losartan 50 mg/day and chlorthalidone has been increased to 50 mg/day.  We previously had her on 25 mg/day and decrease the dose to the 12.5 mg/day of chlorthalidone.  She is physically active.  And has no particular complaints.  Past Medical History:  Diagnosis Date   Anxiety    Chest pain    Chronic pain syndrome    DDD (degenerative disc disease), lumbar    Diabetes mellitus without complication ([place])    Edema    Family history of colonic polyps    Fibroids    Fibromyalgia    GERD without esophagitis    Hypertension    Hypertensive retinopathy of both eyes    Hypoglycemia    Lichen planus    Lichen planus    Localized edema    Morbid obesity due to excess calories (HCC)    Osteoarthritis    Other constipation    Overactive bladder    Palpitations    Right-sided abdominal pain of unknown cause    RLQ abdominal pain    RUQ abdominal pain    Thyroid cyst    Type 2 diabetes mellitus without complication, without long-term current use of insulin (HCC)    Varicose veins of both lower extremities with pain    Vertigo    Vitamin D deficiency     Past Surgical History:  Procedure Laterality Date   arthroscopic knee     CESAREAN SECTION      HYSTEROSCOPY      Current Medications: Current Meds  Medication Sig   acetaminophen (TYLENOL) 500 MG tablet Take 650 mg by mouth every 6 (six) hours as needed for mild pain.   chlorthalidone (HYGROTON) 25 MG tablet Take 50 mg by mouth daily.   Cholecalciferol (VITAMIN D3) 5000 units CAPS Take 5,000 Units by mouth daily.   cyclobenzaprine (FLEXERIL) 10 MG tablet Take 10 mg by mouth at bedtime as needed. For muscle spasms   diazepam (VALIUM) 5 MG tablet Take 5 mg by mouth every 6 (six) hours as needed. For anxiety   hydrOXYzine (ATARAX/VISTARIL) 10 MG tablet TAKE 1 TABLET BY MOUTH ONCE DAILY AT BEDTIME AS NEEDED FOR ITCHING FOR 90 DAYS   meclizine (ANTIVERT) 12.5 MG tablet Take 12.5 mg by mouth 3 (three) times daily as needed. dizziness   oxybutynin (DITROPAN) 5 MG tablet TAKE 1 TABLET BY MOUTH TWICE DAILY AS NEEDED FOR URINARY FREQUENCY   pantoprazole (PROTONIX) 40 MG tablet Take 40 mg by mouth daily.    PRESCRIPTION MEDICATION Take 1 tablet by mouth 3 (three) times a week. METHYLCOBALAMIN   traMADol (ULTRAM) 50 MG tablet Take 100 mg by mouth every 6 (six) hours as needed. For pain     Allergies:  Mobic [meloxicam], Codeine, Demerol [meperidine], Dilaudid [hydromorphone], Fentanyl, Griseofulvin ultramicrosize [griseofulvin], Lyrica [pregabalin], Morphine and related, Neurontin [gabapentin], Percocet [oxycodone-acetaminophen], and Vicodin [hydrocodone-acetaminophen]   Social History   Socioeconomic History   Marital status: Married    Spouse name: Not on file   Number of children: Not on file   Years of education: Not on file   Highest education level: Not on file  Occupational History   Not on file  Tobacco Use   Smoking status: Never   Smokeless tobacco: Never  Substance and Sexual Activity   Alcohol use: No   Drug use: No   Sexual activity: Not on file  Other Topics Concern   Not on file  Social History Narrative   Not on file   Social Determinants of Health   Financial  Resource Strain: Not on file  Food Insecurity: Not on file  Transportation Needs: Not on file  Physical Activity: Not on file  Stress: Not on file  Social Connections: Not on file     Family History: The patient's family history includes CAD in her father; Colon polyps in her mother and sister; Diabetes in her maternal grandfather, maternal grandmother, mother, paternal grandfather, sister, and sister; Heart disease in her mother; Heart failure in her father; Hyperlipidemia in her mother; Hypertension in her maternal grandmother; Stroke in her paternal grandmother.  ROS:   Please see the history of present illness.    Has had some difficulty getting losartan because she was living in [place] with her daughter during a medical crisis for her daughter.  She began cutting losartan in half and realized that her blood pressures on the higher dose of chlorthalidone and lower dose of losartan were excellent and has continued the lower dose of losartan.  All other systems reviewed and are negative.  EKGs/Labs/Other Studies Reviewed:    The following studies were reviewed today: No new data  EKG:  EKG normal sinus rhythm with normal appearance other than nonspecific T wave flattening.  Recent Labs: No results found for requested labs within last 8760 hours.  Recent Lipid Panel No results found for: CHOL, TRIG, HDL, CHOLHDL, VLDL, LDLCALC, LDLDIRECT  Physical Exam:    VS:  BP 102/64   Pulse (!) 59   Ht '5\' 3"'$  (1.6 m)   Wt 215 lb (97.5 kg)   SpO2 98%   BMI 38.09 kg/m     Wt Readings from Last 3 Encounters:  09/07/20 215 lb (97.5 kg)  07/01/19 235 lb (106.6 kg)  04/17/19 236 lb 12.8 oz (107.4 kg)     GEN: Obese. No acute distress HEENT: Normal NECK: No JVD. LYMPHATICS: No lymphadenopathy CARDIAC: No murmur. RRR no gallop, or edema. VASCULAR:  Normal Pulses. No bruits. RESPIRATORY:  Clear to auscultation without rales, wheezing or rhonchi  ABDOMEN: Soft, non-tender,  non-distended, No pulsatile mass, MUSCULOSKELETAL: No deformity  SKIN: Warm and dry NEUROLOGIC:  Alert and oriented x 3 PSYCHIATRIC:  Normal affect   ASSESSMENT:    1. Essential hypertension   2. Chest discomfort   3. Controlled type 2 diabetes mellitus with complication, without long-term current use of insulin (HCC)   4. Palpitations    PLAN:    In order of problems listed above:  Excellent blood pressure control.  Basic metabolic panel to ensure that kidney function and potassium are normal.  As needed follow-up relative to blood pressure from cardiology standpoint. Clinical follow-up if any recurrent chest discomfort.  Please continue aggressive risk factor modification.  Overall education and awareness concerning primary/secondary risk prevention was discussed in detail: LDL less than 70, hemoglobin A1c less than 7, blood pressure target less than 130/80 mmHg, >150 minutes of moderate aerobic activity per week, avoidance of smoking, weight control (via diet and exercise), and continued surveillance/management of/for obstructive sleep apnea.    Medication Adjustments/Labs and Tests Ordered: Current medicines are reviewed at length with the patient today.  Concerns regarding medicines are outlined above.  Orders Placed This Encounter  Procedures   Basic metabolic panel   EKG [id number]   No orders of the defined types were placed in this encounter.   Patient Instructions  Medication Instructions:  Your physician recommends that you continue on your current medications as directed. Please refer to the Current Medication list given to you today.  *If you need a refill on your cardiac medications before your next appointment, please call your pharmacy*   Lab Work: BMET today  If you have labs (blood work) drawn today and your tests are completely normal, you will receive your results only by: [place] (if you have MyChart) OR A paper copy in the mail If you have  any lab test that is abnormal or we need to change your treatment, we will call you to review the results.   Testing/Procedures: None   Follow-Up: At [facility], you and your health needs are our priority.  As part of our continuing mission to provide you with exceptional heart care, we have created designated Provider Care Teams.  These Care Teams include your primary Cardiologist (physician) and Advanced Practice Providers (APPs -  Physician Assistants and Nurse Practitioners) who all work together to provide you with the care you need, when you need it.  We recommend signing up for the patient portal called "MyChart".  Sign up information is provided on this After Visit Summary.  MyChart is used to connect with patients for Virtual Visits (Telemedicine).  Patients are able to view lab/test results, encounter notes, upcoming appointments, etc.  Non-urgent messages can be sent to your provider as well.   To learn more about what you can do with MyChart, go to [email or web address].    Your next appointment:   As needed  The format for your next appointment:   In Person  Provider:   You may see Sinclair Grooms, MD or one of the following Advanced Practice Providers on your designated Care Team:   Cecilie Kicks, NP   Other Instructions     Signed, Sinclair Grooms, MD  09/07/2020 9:20 AM    [place]

## 2020-09-07 ENCOUNTER — Ambulatory Visit: Payer: Medicare Other | Admitting: Interventional Cardiology

## 2020-09-07 ENCOUNTER — Encounter: Payer: Self-pay | Admitting: Interventional Cardiology

## 2020-09-07 ENCOUNTER — Other Ambulatory Visit: Payer: Self-pay

## 2020-09-07 VITALS — BP 102/64 | HR 59 | Ht 63.0 in | Wt 215.0 lb

## 2020-09-07 DIAGNOSIS — R0789 Other chest pain: Secondary | ICD-10-CM

## 2020-09-07 DIAGNOSIS — E118 Type 2 diabetes mellitus with unspecified complications: Secondary | ICD-10-CM

## 2020-09-07 DIAGNOSIS — I10 Essential (primary) hypertension: Secondary | ICD-10-CM | POA: Diagnosis not present

## 2020-09-07 DIAGNOSIS — R002 Palpitations: Secondary | ICD-10-CM

## 2020-09-07 LAB — BASIC METABOLIC PANEL
BUN/Creatinine Ratio: 15 (ref 9–23)
BUN: 14 mg/dL (ref 6–24)
CO2: 27 mmol/L (ref 20–29)
Calcium: 9.6 mg/dL (ref 8.7–10.2)
Chloride: 98 mmol/L (ref 96–106)
Creatinine, Ser: 0.91 mg/dL (ref 0.57–1.00)
Glucose: 95 mg/dL (ref 65–99)
Potassium: 3.8 mmol/L (ref 3.5–5.2)
Sodium: 140 mmol/L (ref 134–144)
eGFR: 73 mL/min/{1.73_m2} (ref >59)

## 2020-09-07 NOTE — Patient Instructions (Signed)
Medication Instructions:  Your physician recommends that you continue on your current medications as directed. Please refer to the Current Medication list given to you today.  *If you need a refill on your cardiac medications before your next appointment, please call your pharmacy*   Lab Work: BMET today  If you have labs (blood work) drawn today and your tests are completely normal, you will receive your results only by: [place] (if you have MyChart) OR A paper copy in the mail If you have any lab test that is abnormal or we need to change your treatment, we will call you to review the results.   Testing/Procedures: None   Follow-Up: At [facility], you and your health needs are our priority.  As part of our continuing mission to provide you with exceptional heart care, we have created designated Provider Care Teams.  These Care Teams include your primary Cardiologist (physician) and Advanced Practice Providers (APPs -  Physician Assistants and Nurse Practitioners) who all work together to provide you with the care you need, when you need it.  We recommend signing up for the patient portal called "MyChart".  Sign up information is provided on this After Visit Summary.  MyChart is used to connect with patients for Virtual Visits (Telemedicine).  Patients are able to view lab/test results, encounter notes, upcoming appointments, etc.  Non-urgent messages can be sent to your provider as well.   To learn more about what you can do with MyChart, go to [email or web address].    Your next appointment:   As needed  The format for your next appointment:   In Person  Provider:   You may see Sinclair Grooms, MD or one of the following Advanced Practice Providers on your designated Care Team:   Cecilie Kicks, NP   Other Instructions

## 2020-09-14 DIAGNOSIS — H04123 Dry eye syndrome of bilateral lacrimal glands: Secondary | ICD-10-CM | POA: Diagnosis not present

## 2020-09-14 DIAGNOSIS — H25813 Combined forms of age-related cataract, bilateral: Secondary | ICD-10-CM | POA: Diagnosis not present

## 2020-09-19 ENCOUNTER — Other Ambulatory Visit: Payer: Self-pay | Admitting: Family Medicine

## 2020-09-19 DIAGNOSIS — M4807 Spinal stenosis, lumbosacral region: Secondary | ICD-10-CM | POA: Diagnosis not present

## 2020-09-19 DIAGNOSIS — I10 Essential (primary) hypertension: Secondary | ICD-10-CM | POA: Diagnosis not present

## 2020-09-21 ENCOUNTER — Ambulatory Visit
Admission: RE | Admit: 2020-09-21 | Discharge: 2020-09-21 | Disposition: A | Discharge status: Home / Self Care | Payer: Medicare Other | Source: Ambulatory Visit | Attending: Family Medicine | Admitting: Family Medicine

## 2020-09-21 ENCOUNTER — Other Ambulatory Visit: Payer: Self-pay

## 2020-09-21 DIAGNOSIS — M48061 Spinal stenosis, lumbar region without neurogenic claudication: Secondary | ICD-10-CM | POA: Diagnosis not present

## 2020-09-21 DIAGNOSIS — M545 Low back pain, unspecified: Secondary | ICD-10-CM | POA: Diagnosis not present

## 2020-09-21 DIAGNOSIS — M4807 Spinal stenosis, lumbosacral region: Secondary | ICD-10-CM

## 2020-11-03 DIAGNOSIS — M170 Bilateral primary osteoarthritis of knee: Secondary | ICD-10-CM | POA: Diagnosis not present

## 2020-11-03 DIAGNOSIS — M1711 Unilateral primary osteoarthritis, right knee: Secondary | ICD-10-CM | POA: Diagnosis not present

## 2020-11-07 DIAGNOSIS — M47816 Spondylosis without myelopathy or radiculopathy, lumbar region: Secondary | ICD-10-CM | POA: Diagnosis not present

## 2020-11-07 DIAGNOSIS — M48062 Spinal stenosis, lumbar region with neurogenic claudication: Secondary | ICD-10-CM | POA: Diagnosis not present

## 2020-11-07 DIAGNOSIS — M5136 Other intervertebral disc degeneration, lumbar region: Secondary | ICD-10-CM | POA: Diagnosis not present

## 2020-11-22 DIAGNOSIS — M48062 Spinal stenosis, lumbar region with neurogenic claudication: Secondary | ICD-10-CM | POA: Diagnosis not present

## 2020-11-22 DIAGNOSIS — M5136 Other intervertebral disc degeneration, lumbar region: Secondary | ICD-10-CM | POA: Diagnosis not present

## 2020-11-25 DIAGNOSIS — K5909 Other constipation: Secondary | ICD-10-CM | POA: Diagnosis not present

## 2020-11-25 DIAGNOSIS — R600 Localized edema: Secondary | ICD-10-CM | POA: Diagnosis not present

## 2020-11-25 DIAGNOSIS — M797 Fibromyalgia: Secondary | ICD-10-CM | POA: Diagnosis not present

## 2020-11-25 DIAGNOSIS — Z0000 Encounter for general adult medical examination without abnormal findings: Secondary | ICD-10-CM | POA: Diagnosis not present

## 2020-11-25 DIAGNOSIS — E1169 Type 2 diabetes mellitus with other specified complication: Secondary | ICD-10-CM | POA: Diagnosis not present

## 2020-11-25 DIAGNOSIS — G894 Chronic pain syndrome: Secondary | ICD-10-CM | POA: Diagnosis not present

## 2020-11-25 DIAGNOSIS — I10 Essential (primary) hypertension: Secondary | ICD-10-CM | POA: Diagnosis not present

## 2020-11-30 DIAGNOSIS — R269 Unspecified abnormalities of gait and mobility: Secondary | ICD-10-CM | POA: Diagnosis not present

## 2020-11-30 DIAGNOSIS — M48062 Spinal stenosis, lumbar region with neurogenic claudication: Secondary | ICD-10-CM | POA: Diagnosis not present

## 2020-11-30 DIAGNOSIS — M5136 Other intervertebral disc degeneration, lumbar region: Secondary | ICD-10-CM | POA: Diagnosis not present

## 2020-11-30 DIAGNOSIS — M25561 Pain in right knee: Secondary | ICD-10-CM | POA: Diagnosis not present

## 2020-11-30 DIAGNOSIS — M25562 Pain in left knee: Secondary | ICD-10-CM | POA: Diagnosis not present

## 2020-11-30 DIAGNOSIS — M545 Low back pain, unspecified: Secondary | ICD-10-CM | POA: Diagnosis not present

## 2020-12-05 DIAGNOSIS — M545 Low back pain, unspecified: Secondary | ICD-10-CM | POA: Diagnosis not present

## 2020-12-05 DIAGNOSIS — M25561 Pain in right knee: Secondary | ICD-10-CM | POA: Diagnosis not present

## 2020-12-05 DIAGNOSIS — M5136 Other intervertebral disc degeneration, lumbar region: Secondary | ICD-10-CM | POA: Diagnosis not present

## 2020-12-05 DIAGNOSIS — R269 Unspecified abnormalities of gait and mobility: Secondary | ICD-10-CM | POA: Diagnosis not present

## 2020-12-05 DIAGNOSIS — M48062 Spinal stenosis, lumbar region with neurogenic claudication: Secondary | ICD-10-CM | POA: Diagnosis not present

## 2020-12-05 DIAGNOSIS — M25562 Pain in left knee: Secondary | ICD-10-CM | POA: Diagnosis not present

## 2020-12-07 DIAGNOSIS — M1712 Unilateral primary osteoarthritis, left knee: Secondary | ICD-10-CM | POA: Diagnosis not present

## 2020-12-07 DIAGNOSIS — M1711 Unilateral primary osteoarthritis, right knee: Secondary | ICD-10-CM | POA: Diagnosis not present

## 2020-12-12 DIAGNOSIS — M48062 Spinal stenosis, lumbar region with neurogenic claudication: Secondary | ICD-10-CM | POA: Diagnosis not present

## 2020-12-12 DIAGNOSIS — M25561 Pain in right knee: Secondary | ICD-10-CM | POA: Diagnosis not present

## 2020-12-12 DIAGNOSIS — M545 Low back pain, unspecified: Secondary | ICD-10-CM | POA: Diagnosis not present

## 2020-12-12 DIAGNOSIS — M5136 Other intervertebral disc degeneration, lumbar region: Secondary | ICD-10-CM | POA: Diagnosis not present

## 2020-12-12 DIAGNOSIS — M25562 Pain in left knee: Secondary | ICD-10-CM | POA: Diagnosis not present

## 2020-12-14 DIAGNOSIS — M5136 Other intervertebral disc degeneration, lumbar region: Secondary | ICD-10-CM | POA: Diagnosis not present

## 2020-12-14 DIAGNOSIS — M25562 Pain in left knee: Secondary | ICD-10-CM | POA: Diagnosis not present

## 2020-12-14 DIAGNOSIS — M48062 Spinal stenosis, lumbar region with neurogenic claudication: Secondary | ICD-10-CM | POA: Diagnosis not present

## 2020-12-14 DIAGNOSIS — M25561 Pain in right knee: Secondary | ICD-10-CM | POA: Diagnosis not present

## 2020-12-14 DIAGNOSIS — M545 Low back pain, unspecified: Secondary | ICD-10-CM | POA: Diagnosis not present

## 2020-12-19 DIAGNOSIS — M545 Low back pain, unspecified: Secondary | ICD-10-CM | POA: Diagnosis not present

## 2020-12-19 DIAGNOSIS — M25561 Pain in right knee: Secondary | ICD-10-CM | POA: Diagnosis not present

## 2020-12-19 DIAGNOSIS — M48062 Spinal stenosis, lumbar region with neurogenic claudication: Secondary | ICD-10-CM | POA: Diagnosis not present

## 2020-12-19 DIAGNOSIS — M25562 Pain in left knee: Secondary | ICD-10-CM | POA: Diagnosis not present

## 2020-12-19 DIAGNOSIS — M5136 Other intervertebral disc degeneration, lumbar region: Secondary | ICD-10-CM | POA: Diagnosis not present

## 2020-12-21 DIAGNOSIS — M25562 Pain in left knee: Secondary | ICD-10-CM | POA: Diagnosis not present

## 2020-12-21 DIAGNOSIS — M25561 Pain in right knee: Secondary | ICD-10-CM | POA: Diagnosis not present

## 2020-12-21 DIAGNOSIS — M545 Low back pain, unspecified: Secondary | ICD-10-CM | POA: Diagnosis not present

## 2020-12-21 DIAGNOSIS — M48062 Spinal stenosis, lumbar region with neurogenic claudication: Secondary | ICD-10-CM | POA: Diagnosis not present

## 2020-12-21 DIAGNOSIS — M5136 Other intervertebral disc degeneration, lumbar region: Secondary | ICD-10-CM | POA: Diagnosis not present

## 2020-12-26 DIAGNOSIS — M545 Low back pain, unspecified: Secondary | ICD-10-CM | POA: Diagnosis not present

## 2020-12-26 DIAGNOSIS — M25561 Pain in right knee: Secondary | ICD-10-CM | POA: Diagnosis not present

## 2020-12-26 DIAGNOSIS — M48062 Spinal stenosis, lumbar region with neurogenic claudication: Secondary | ICD-10-CM | POA: Diagnosis not present

## 2020-12-26 DIAGNOSIS — M25562 Pain in left knee: Secondary | ICD-10-CM | POA: Diagnosis not present

## 2020-12-26 DIAGNOSIS — M5136 Other intervertebral disc degeneration, lumbar region: Secondary | ICD-10-CM | POA: Diagnosis not present

## 2020-12-28 DIAGNOSIS — M48062 Spinal stenosis, lumbar region with neurogenic claudication: Secondary | ICD-10-CM | POA: Diagnosis not present

## 2020-12-28 DIAGNOSIS — M5136 Other intervertebral disc degeneration, lumbar region: Secondary | ICD-10-CM | POA: Diagnosis not present

## 2020-12-28 DIAGNOSIS — M25561 Pain in right knee: Secondary | ICD-10-CM | POA: Diagnosis not present

## 2020-12-28 DIAGNOSIS — M545 Low back pain, unspecified: Secondary | ICD-10-CM | POA: Diagnosis not present

## 2020-12-28 DIAGNOSIS — M25562 Pain in left knee: Secondary | ICD-10-CM | POA: Diagnosis not present

## 2021-01-02 DIAGNOSIS — M48062 Spinal stenosis, lumbar region with neurogenic claudication: Secondary | ICD-10-CM | POA: Diagnosis not present

## 2021-01-02 DIAGNOSIS — M25562 Pain in left knee: Secondary | ICD-10-CM | POA: Diagnosis not present

## 2021-01-02 DIAGNOSIS — M6281 Muscle weakness (generalized): Secondary | ICD-10-CM | POA: Diagnosis not present

## 2021-01-02 DIAGNOSIS — M545 Low back pain, unspecified: Secondary | ICD-10-CM | POA: Diagnosis not present

## 2021-01-02 DIAGNOSIS — M25561 Pain in right knee: Secondary | ICD-10-CM | POA: Diagnosis not present

## 2021-01-02 DIAGNOSIS — Z1231 Encounter for screening mammogram for malignant neoplasm of breast: Secondary | ICD-10-CM | POA: Diagnosis not present

## 2021-01-02 DIAGNOSIS — M5136 Other intervertebral disc degeneration, lumbar region: Secondary | ICD-10-CM | POA: Diagnosis not present

## 2021-01-04 DIAGNOSIS — M25562 Pain in left knee: Secondary | ICD-10-CM | POA: Diagnosis not present

## 2021-01-04 DIAGNOSIS — M48062 Spinal stenosis, lumbar region with neurogenic claudication: Secondary | ICD-10-CM | POA: Diagnosis not present

## 2021-01-04 DIAGNOSIS — M25561 Pain in right knee: Secondary | ICD-10-CM | POA: Diagnosis not present

## 2021-01-04 DIAGNOSIS — M545 Low back pain, unspecified: Secondary | ICD-10-CM | POA: Diagnosis not present

## 2021-01-04 DIAGNOSIS — M5136 Other intervertebral disc degeneration, lumbar region: Secondary | ICD-10-CM | POA: Diagnosis not present

## 2021-01-11 DIAGNOSIS — M25561 Pain in right knee: Secondary | ICD-10-CM | POA: Diagnosis not present

## 2021-01-11 DIAGNOSIS — M25562 Pain in left knee: Secondary | ICD-10-CM | POA: Diagnosis not present

## 2021-01-11 DIAGNOSIS — M48062 Spinal stenosis, lumbar region with neurogenic claudication: Secondary | ICD-10-CM | POA: Diagnosis not present

## 2021-01-11 DIAGNOSIS — M545 Low back pain, unspecified: Secondary | ICD-10-CM | POA: Diagnosis not present

## 2021-01-11 DIAGNOSIS — M5136 Other intervertebral disc degeneration, lumbar region: Secondary | ICD-10-CM | POA: Diagnosis not present

## 2021-01-16 DIAGNOSIS — M48062 Spinal stenosis, lumbar region with neurogenic claudication: Secondary | ICD-10-CM | POA: Diagnosis not present

## 2021-01-16 DIAGNOSIS — M25562 Pain in left knee: Secondary | ICD-10-CM | POA: Diagnosis not present

## 2021-01-16 DIAGNOSIS — M545 Low back pain, unspecified: Secondary | ICD-10-CM | POA: Diagnosis not present

## 2021-01-16 DIAGNOSIS — M5136 Other intervertebral disc degeneration, lumbar region: Secondary | ICD-10-CM | POA: Diagnosis not present

## 2021-01-16 DIAGNOSIS — M25561 Pain in right knee: Secondary | ICD-10-CM | POA: Diagnosis not present

## 2021-01-18 DIAGNOSIS — M545 Low back pain, unspecified: Secondary | ICD-10-CM | POA: Diagnosis not present

## 2021-01-18 DIAGNOSIS — M25561 Pain in right knee: Secondary | ICD-10-CM | POA: Diagnosis not present

## 2021-01-18 DIAGNOSIS — M48062 Spinal stenosis, lumbar region with neurogenic claudication: Secondary | ICD-10-CM | POA: Diagnosis not present

## 2021-01-18 DIAGNOSIS — M25562 Pain in left knee: Secondary | ICD-10-CM | POA: Diagnosis not present

## 2021-01-18 DIAGNOSIS — M5136 Other intervertebral disc degeneration, lumbar region: Secondary | ICD-10-CM | POA: Diagnosis not present

## 2021-02-08 DIAGNOSIS — M1711 Unilateral primary osteoarthritis, right knee: Secondary | ICD-10-CM | POA: Diagnosis not present

## 2021-02-08 DIAGNOSIS — M1712 Unilateral primary osteoarthritis, left knee: Secondary | ICD-10-CM | POA: Diagnosis not present

## 2021-03-21 ENCOUNTER — Encounter: Payer: Self-pay | Admitting: Neurology

## 2021-03-21 ENCOUNTER — Ambulatory Visit: Payer: Medicare Other | Admitting: Neurology

## 2021-03-21 VITALS — BP 146/73 | HR 72 | Ht 63.0 in | Wt 212.0 lb

## 2021-03-21 DIAGNOSIS — I10 Essential (primary) hypertension: Secondary | ICD-10-CM

## 2021-03-21 DIAGNOSIS — M797 Fibromyalgia: Secondary | ICD-10-CM | POA: Diagnosis not present

## 2021-03-21 DIAGNOSIS — E118 Type 2 diabetes mellitus with unspecified complications: Secondary | ICD-10-CM | POA: Diagnosis not present

## 2021-03-21 DIAGNOSIS — M48062 Spinal stenosis, lumbar region with neurogenic claudication: Secondary | ICD-10-CM | POA: Diagnosis not present

## 2021-03-21 MED ORDER — ZONISAMIDE 100 MG PO CAPS: 100.0000 mg | ORAL_CAPSULE | Freq: Every day | ORAL | 5 refills | Status: DC

## 2021-03-21 NOTE — Progress Notes (Signed)
[facility]  PATIENT: Jessica [name] DOB: February 12, 1960  REFERRING DOCTOR OR PCP: Georgeanna Harrison, MD; Shirline Frees (PCP) SOURCE: Patient, notes from Dr. Mable Fill, imaging and lab reports  _________________________________   HISTORICAL  CHIEF COMPLAINT:  Chief Complaint  Patient presents with   New Patient (Initial Visit)    Rm 2, alone. Paper referral from [facility] for increasing bilateral LE weakness and paresthesia. Pt c/o of numness in her R foot with onset 3 weeks ago. Radiating now to her upper leg. Prickly, burning, and sensitive. Mainly in R leg but at times in L leg. Pt does have fibromyalgia and states her sx are different type of pn. Takes tramadol and tylenol for pn but doesn't completely get rid of pn.     HISTORY OF PRESENT ILLNESS:  I had the pleasure of seeing patient, Jessica [name], at [facility] for neurologic consultation regarding her right leg pain and numbness.  She is a 61 year old woman with right > left lg pain.   She has pins/needles and burning sensation mixed with tightness in the right leg.   She also feels hte right leg is mildly weak and feels heavy.     Pain increases with exercise.    She takes tramadol for fibromyalgia and that helps.  Her dose is fairly high at 400 mg a day..   She had not tolerated stronger opioids in the past.   She had been on Lyrica and gabapentin at various times but had trouble tolerating it.     She had back pain that started a few years ago but now leg pain is worse than LBP.    Pain worsens with walking and she feels weaker as she walks further.   She leans forward as she walks.     An ESI had not helped her in the past.   PT helped a bit (aquatic) but then pain became worse again.    She saw Dr. Mable Fill at orthopedics due to knee pain.  He feels that the degenerative changes in the knee are unlikely to explain most of her pain.  She reports that previously an orthopedic surgeon had  told her that pain was coming from her back.  She has seen Dr. Leonel Ramsay ([facility]).   She wrote in her note "I discussed both surgical and non-surgical interventions with the patient. Surgical intervention would include bilateral L3/4, L4/5 decompressive laminectomies with partial medial facet joint removal. Non-surgical intervention would include continuing with the medication management, physical therapy, and right L4/5, L3/4 transforaminal epidural steroid injections. The patient was offered surgery in the past, but describes lumbar fusion. I counseled the patient and her husband that at this point I felt it reasonable to try lumbar decompression without fusion, counseling them that this surgery would help to resolve her neurogenic claudication, radicular leg symptoms, but was not indicated for chronic low back pain. I further counseled that if back pain was her biggest complaint then she would benefit from a daily core/lumbar fitness program and ongoing comprehensive medical management."  MRI lumbar 09/21/2020 showed moderate to severe spinal stenosis at L3L4 and severe at L4L5.  Lateral recess stenosis at both levels.   Severe facet hypeetrophy at both levels.    Milder DJD other levels.    She was has a history of fibromyalgia.  She was unable to tolerate gabapentin and Lyrica.  She has HTN and borderline DM    REVIEW OF SYSTEMS: Constitutional: No fevers, chills,  sweats, or change in appetite Eyes: No visual changes, double vision, eye pain Ear, nose and throat: No hearing loss, ear pain, nasal congestion, sore throat Cardiovascular: No chest pain, palpitations Respiratory:  No shortness of breath at rest or with exertion.   No wheezes GastrointestinaI: No nausea, vomiting, diarrhea, abdominal pain, fecal incontinence Genitourinary:  No dysuria, urinary retention or frequency.  No nocturia. Musculoskeletal: As above Integumentary: No rash, pruritus, skin lesions Neurological: as  above Psychiatric: No depression at this time.  No anxiety Endocrine: No palpitations, diaphoresis, change in appetite, change in weigh or increased thirst Hematologic/Lymphatic:  No anemia, purpura, petechiae. Allergic/Immunologic: No itchy/runny eyes, nasal congestion, recent allergic reactions, rashes  ALLERGIES: Allergies  Allergen Reactions   Mobic [Meloxicam] Shortness Of Breath   Codeine     Unknown   Demerol [Meperidine]     Unknown   Dilaudid [Hydromorphone]    Fentanyl     Unknown   Griseofulvin Ultramicrosize [Griseofulvin]    Lyrica [Pregabalin]    Morphine And Related     Unknown   Neurontin [Gabapentin]    Percocet [Oxycodone-Acetaminophen]     Unknown   Vicodin [Hydrocodone-Acetaminophen]     HOME MEDICATIONS:  Current Outpatient Medications:    acetaminophen (TYLENOL) 650 MG CR tablet, Take 1 tablet by mouth every 8 (eight) hours as needed., Disp: , Rfl:    chlorthalidone (HYGROTON) 25 MG tablet, Take 50 mg by mouth daily., Disp: 30 tablet, Rfl: 11   Cholecalciferol (VITAMIN D3) 5000 units CAPS, Take 5,000 Units by mouth daily., Disp: , Rfl:    diazepam (VALIUM) 5 MG tablet, Take 5 mg by mouth every 6 (six) hours as needed. For anxiety, Disp: , Rfl:    hydrOXYzine (ATARAX/VISTARIL) 10 MG tablet, TAKE 1 TABLET BY MOUTH ONCE DAILY AT BEDTIME AS NEEDED FOR ITCHING FOR 90 DAYS, Disp: , Rfl:    losartan (COZAAR) 50 MG tablet, Take 50 mg by mouth daily., Disp: , Rfl:    meclizine (ANTIVERT) 12.5 MG tablet, Take 12.5 mg by mouth 3 (three) times daily as needed. dizziness, Disp: , Rfl:    pantoprazole (PROTONIX) 40 MG tablet, Take 40 mg by mouth daily. , Disp: , Rfl:    PRESCRIPTION MEDICATION, Take 1 tablet by mouth 3 (three) times a week. METHYLCOBALAMIN, Disp: , Rfl:    traMADol (ULTRAM) 50 MG tablet, Take 100 mg by mouth every 6 (six) hours as needed. For pain, Disp: , Rfl:    zonisamide (ZONEGRAN) 100 MG capsule, Take 1 capsule (100 mg total) by mouth daily.,  Disp: 30 capsule, Rfl: 5  PAST MEDICAL HISTORY: Past Medical History:  Diagnosis Date   Anxiety    Chest pain    Chronic pain syndrome    DDD (degenerative disc disease), lumbar    Diabetes mellitus without complication (HCC)    Edema    Family history of colonic polyps    Fibroids    Fibromyalgia    GERD without esophagitis    Hypertension    Hypertensive retinopathy of both eyes    Hypoglycemia    Lichen planus    Lichen planus    Localized edema    Morbid obesity due to excess calories (HCC)    Osteoarthritis    Other constipation    Overactive bladder    Palpitations    Right-sided abdominal pain of unknown cause    RLQ abdominal pain    RUQ abdominal pain    Thyroid cyst    Type 2 diabetes  mellitus without complication, without long-term current use of insulin (HCC)    Varicose veins of both lower extremities with pain    Vertigo    Vitamin D deficiency     PAST SURGICAL HISTORY: Past Surgical History:  Procedure Laterality Date   arthroscopic knee     CESAREAN SECTION     HYSTEROSCOPY      FAMILY HISTORY: Family History  Problem Relation Age of Onset   Hyperlipidemia Mother    Colon polyps Mother    Diabetes Mother    Heart disease Mother        pacemaker   Heart failure Father        defibrllator   CAD Father        cardiac valve replacement   Colon polyps Sister    Diabetes Sister    Diabetes Maternal Grandmother    Hypertension Maternal Grandmother    Diabetes Maternal Grandfather    Stroke Paternal Grandmother    Diabetes Paternal Grandfather    Diabetes Sister     SOCIAL HISTORY:  Social History   Socioeconomic History   Marital status: Married    Spouse name: Gwyndolyn Saxon   Number of children: 3   Years of education: Not on file   Highest education level: Bachelor's degree (e.g., BA, AB, BS)  Occupational History   Not on file  Tobacco Use   Smoking status: Never   Smokeless tobacco: Never  Substance and Sexual Activity    Alcohol use: No   Drug use: No   Sexual activity: Not on file  Other Topics Concern   Not on file  Social History Narrative   Lives at home with husband and 1 child   Right handed   Caffeine: 1 cup of coffee 3x a week. No sodas    Social Determinants of Radio broadcast assistant Strain: Not on file  Food Insecurity: Not on file  Transportation Needs: Not on file  Physical Activity: Not on file  Stress: Not on file  Social Connections: Not on file  Intimate Partner Violence: Not on file     PHYSICAL EXAM  Vitals:   03/21/21 1026  BP: (!) 146/73  Pulse: 72  Weight: 212 lb (96.2 kg)  Height: 5\' 3"  (1.6 m)    Body mass index is 37.55 kg/m.   General: The patient is well-developed and well-nourished and in no acute distress  HEENT:  Head is [place]/AT.  Sclera are anicteric.    Neck: No carotid bruits are noted.  The neck is nontender.  Cardiovascular: The heart has a regular rate and rhythm with a normal S1 and S2. There were no murmurs, gallops or rubs.    Skin: Extremities are without rash or  edema.  Musculoskeletal:  Back is nontender  Neurologic Exam  Mental status: The patient is alert and oriented x 3 at the time of the examination. The patient has apparent normal recent and remote memory, with an apparently normal attention span and concentration ability.   Speech is normal.  Cranial nerves: Extraocular movements are full.  Facial strength and sensation was normal.. No obvious hearing deficits are noted.  Motor:  Muscle bulk is normal.   Tone is normal. Strength is  5 / 5 in all 4 extremities for more testing.  On functional testing she has difficulty walking on the heels.,  Worse on the right.   Sensory: Sensory testing is intact to pinprick, soft touch and vibration sensation in all 4 extremities.  Coordination: Cerebellar testing reveals good finger-nose-finger and heel-to-shin bilaterally.  Gait and station: Station is normal.   Gait is arthritic with  small stride..  Tandem is wide Romberg is negative.   Reflexes: Deep tendon reflexes are symmetric and normal bilaterally.   Plantar responses are flexor.    DIAGNOSTIC DATA (LABS, IMAGING, TESTING) - I reviewed patient records, labs, notes, testing and imaging myself where available.  Lab Results  Component Value Date   WBC 4.5 12/08/2011   HGB 12.4 12/08/2011   HCT 39.0 12/08/2011   MCV 80.1 12/08/2011   PLT 245 12/08/2011      Component Value Date/Time   NA 140 09/07/2020 0903   K 3.8 09/07/2020 0903   CL 98 09/07/2020 0903   CO2 27 09/07/2020 0903   GLUCOSE 95 09/07/2020 0903   GLUCOSE 113 (H) 12/08/2011 1314   BUN 14 09/07/2020 0903   CREATININE 0.91 09/07/2020 0903   CALCIUM 9.6 09/07/2020 0903   PROT 8.1 12/08/2011 1314   ALBUMIN 4.2 12/08/2011 1314   AST 20 12/08/2011 1314   ALT 17 12/08/2011 1314   ALKPHOS 83 12/08/2011 1314   BILITOT 0.3 12/08/2011 1314   GFRNONAA 73 06/04/2019 1417   GFRAA 84 06/04/2019 1417       ASSESSMENT AND PLAN  Spinal stenosis of lumbar region with neurogenic claudication  Fibromyalgia  Controlled type 2 diabetes mellitus with complication, without long-term current use of insulin ([place])  Essential hypertension  In summary, Ms. Whittingham is a 61 year old woman with severe spinal stenosis at L4-L5 and moderately severe spinal stenosis at L3-L4.  She has symptoms consistent with neurogenic claudication.  In the past, she had ESI without benefit.  She was unable to tolerate gabapentin and Lyrica.  Due to her degree of pain, the severity of the spinal stenosis with neurogenic claudication and her general fairly good health, I believe she will likely need to have a surgical procedure performed.  2 different types of operations have been described to her by 2 different surgeons, spinal fusion and a more limited laminectomy with medial facetotomy.  She is reluctant to have a more invasive operation.  She is on fairly high-dose tramadol (8  x 50 mg a day) because of drug interaction I will avoid tricyclic's.  She was unable to tolerate gabapentin or Lyrica.  I will have her start zonisamide and titrate up as tolerated as it is sometimes useful for neuropathic type pain.  She will return to see me as needed based on her decisions for treatment and is advised to call if she has new or worsening neurologic symptoms.  Thank you for asking me to see Ms. Mcveigh.  Please let me know if I can be of further assistance with her or other patients in the future.  Katalena Malveaux A. Felecia Shelling, MD, [facility] Shave 9/98/3382, 5:05 PM Certified in Neurology, Clinical Neurophysiology, Sleep Medicine and Neuroimaging  [facility] [address] [place], [place] [address] [phone]

## 2021-04-10 DIAGNOSIS — M47816 Spondylosis without myelopathy or radiculopathy, lumbar region: Secondary | ICD-10-CM | POA: Diagnosis not present

## 2021-04-10 DIAGNOSIS — M48062 Spinal stenosis, lumbar region with neurogenic claudication: Secondary | ICD-10-CM | POA: Diagnosis not present

## 2021-04-10 DIAGNOSIS — M5136 Other intervertebral disc degeneration, lumbar region: Secondary | ICD-10-CM | POA: Diagnosis not present

## 2021-04-18 ENCOUNTER — Ambulatory Visit: Payer: Medicare Other | Admitting: Neurology

## 2021-04-19 DIAGNOSIS — M5136 Other intervertebral disc degeneration, lumbar region: Secondary | ICD-10-CM | POA: Diagnosis not present

## 2021-04-19 DIAGNOSIS — M4726 Other spondylosis with radiculopathy, lumbar region: Secondary | ICD-10-CM | POA: Diagnosis not present

## 2021-04-19 DIAGNOSIS — M48061 Spinal stenosis, lumbar region without neurogenic claudication: Secondary | ICD-10-CM | POA: Diagnosis not present

## 2021-05-09 ENCOUNTER — Telehealth: Payer: Self-pay | Admitting: Neurology

## 2021-05-09 MED ORDER — ZONISAMIDE 50 MG PO CAPS: 50.0000 mg | ORAL_CAPSULE | Freq: Every day | ORAL | 11 refills | Status: DC

## 2021-05-09 NOTE — Telephone Encounter (Signed)
Pt said having a side effect to zonisamide (ZONEGRAN) 100 MG capsule  . Having tightness in knee up to thigh, nausea, loss of appetite, get real emotional. Would like a call from the nurse. ?

## 2021-05-09 NOTE — Telephone Encounter (Signed)
Called and spoke with pt. Relayed Dr. Garth Bigness recommendation. She is agreeable to plan. E-scribed updated prescription to [facility] per pt request. ?

## 2021-05-09 NOTE — Telephone Encounter (Signed)
Called pt back. She has been on zonegran since 03/21/21. Her right leg was having a lot of numbness/tingling all the way down leg. Therefore, Dr. Felecia Shelling started her on zonegran '100mg'$  po qs. Numb/ting went away from knee down once she started on this. However, she reports her knee/calf muscle got very tight once she took med.  ? ?She was taking tramadol/tylenol on regular basis for pain but she found pain worsened when she was doing this. Therefore, she stopped tylenol and lessened how much tramadol she was taking. When pain does get worse (burning/prickling) she takes tramadol '50mg'$  po prn.  ? ?She also felt hung over from zonisamide, so she would take around 3pm. Stopped medication recently and sx resolved. However, she felt leg got weak off of medication and felt she walked better on medication. Started medication back yesterday. Wanting to know how she should proceed at this point.  ?Aware I will send to MD for review and call back tomorrow at the latest.  ?

## 2021-05-22 DIAGNOSIS — I10 Essential (primary) hypertension: Secondary | ICD-10-CM | POA: Diagnosis not present

## 2021-05-22 DIAGNOSIS — E1169 Type 2 diabetes mellitus with other specified complication: Secondary | ICD-10-CM | POA: Diagnosis not present

## 2021-05-22 DIAGNOSIS — E7800 Pure hypercholesterolemia, unspecified: Secondary | ICD-10-CM | POA: Diagnosis not present

## 2021-06-08 ENCOUNTER — Other Ambulatory Visit: Payer: Self-pay | Admitting: Psychiatry

## 2021-06-08 DIAGNOSIS — E1169 Type 2 diabetes mellitus with other specified complication: Secondary | ICD-10-CM | POA: Diagnosis not present

## 2021-06-08 DIAGNOSIS — Z1211 Encounter for screening for malignant neoplasm of colon: Secondary | ICD-10-CM | POA: Diagnosis not present

## 2021-06-08 DIAGNOSIS — E7800 Pure hypercholesterolemia, unspecified: Secondary | ICD-10-CM | POA: Diagnosis not present

## 2021-06-08 DIAGNOSIS — I10 Essential (primary) hypertension: Secondary | ICD-10-CM | POA: Diagnosis not present

## 2021-06-08 DIAGNOSIS — M797 Fibromyalgia: Secondary | ICD-10-CM | POA: Diagnosis not present

## 2021-06-08 DIAGNOSIS — M48062 Spinal stenosis, lumbar region with neurogenic claudication: Secondary | ICD-10-CM | POA: Diagnosis not present

## 2021-06-08 DIAGNOSIS — R600 Localized edema: Secondary | ICD-10-CM | POA: Diagnosis not present

## 2021-06-08 MED ORDER — ZONISAMIDE 25 MG PO CAPS: 25.0000 mg | ORAL_CAPSULE | Freq: Every day | ORAL | 3 refills | Status: DC

## 2021-06-08 NOTE — Telephone Encounter (Signed)
Called the patient back and it went to VM. LVM advising the patient to call back.  ?**If pt calls back and I am unavailable to answer, please make aware that Dr Felecia Shelling is out of the office. If she can leave as much detailed information about the symptoms she is having I can send to work in MD to review for the pt.  ? ?In reviewing previous phone message this symptom sounds similar to the feeling she was having when she took zonisimide. Does she agree that the tightness around the right thigh is similar to when she called previously? We had reduced the dose 50 mg at bedtime, did she feel symptoms got better by doing this or is she thinking still could be related to medication? ?How often is this sensation occurring, has there been anything that helps? ? ?

## 2021-06-08 NOTE — Telephone Encounter (Signed)
Pt said still feeling like there is rubber band around the right thigh. Would like a call from the nurse. ?

## 2021-06-08 NOTE — Telephone Encounter (Signed)
She can try dropping down to 25 mg at bedtime. I'll send a new rx to her pharmacy

## 2021-06-08 NOTE — Telephone Encounter (Signed)
Pt returned phone call. Pt said yes agree tightness around the thigh is similar to previous call. Symptoms got better when reducing to 50 mg, but some days are just as intense as before. Pt said yes still think could be related to medication.  Sensation is constant and rubbing helps. Would like a call from the nurse ?

## 2021-06-13 ENCOUNTER — Encounter: Payer: Self-pay | Admitting: Neurology

## 2021-06-13 NOTE — Telephone Encounter (Addendum)
I called patient.  She reports that she forgot to take zonisamide 50 mg on Friday.  She did not have any tightness in her thigh that day.  However, when she has been taking zonisamide 50 mg capsules she feels that the tightness in her thigh is present for several hours after taking the medication.  I advised her that she can decrease the dose to 25 mg of zonisamide at bedtime.  The prescription has been sent to her [facility].  Patient will let us know in a week or two if she is still experiencing a thigh tightness even with the decrease to zonisamide 25 mg capsule. ?

## 2021-07-21 ENCOUNTER — Encounter: Payer: Self-pay | Admitting: Neurology

## 2021-07-24 ENCOUNTER — Other Ambulatory Visit: Payer: Self-pay | Admitting: Neurology

## 2021-07-24 MED ORDER — ZONISAMIDE 25 MG PO CAPS: 75.0000 mg | ORAL_CAPSULE | Freq: Every day | ORAL | 3 refills | Status: DC

## 2021-08-31 DIAGNOSIS — M1712 Unilateral primary osteoarthritis, left knee: Secondary | ICD-10-CM | POA: Diagnosis not present

## 2021-08-31 DIAGNOSIS — M1711 Unilateral primary osteoarthritis, right knee: Secondary | ICD-10-CM | POA: Diagnosis not present

## 2021-08-31 DIAGNOSIS — M170 Bilateral primary osteoarthritis of knee: Secondary | ICD-10-CM | POA: Diagnosis not present

## 2021-10-03 ENCOUNTER — Telehealth: Payer: Self-pay | Admitting: *Deleted

## 2021-10-03 NOTE — Telephone Encounter (Addendum)
Faxed back completed/signed surgical clearance form for R knee arthroplasty to [facility]. Per Dr. Felecia Shelling, pt optimized neurologically, low risk neurologically. Fax: [phone]. Received fax confirmation.

## 2021-10-05 DIAGNOSIS — D123 Benign neoplasm of transverse colon: Secondary | ICD-10-CM | POA: Diagnosis not present

## 2021-10-05 DIAGNOSIS — Z8371 Family history of colonic polyps: Secondary | ICD-10-CM | POA: Diagnosis not present

## 2021-10-05 DIAGNOSIS — Z1211 Encounter for screening for malignant neoplasm of colon: Secondary | ICD-10-CM | POA: Diagnosis not present

## 2021-10-05 DIAGNOSIS — K648 Other hemorrhoids: Secondary | ICD-10-CM | POA: Diagnosis not present

## 2021-10-09 DIAGNOSIS — D123 Benign neoplasm of transverse colon: Secondary | ICD-10-CM | POA: Diagnosis not present

## 2021-10-10 DIAGNOSIS — M5416 Radiculopathy, lumbar region: Secondary | ICD-10-CM | POA: Diagnosis not present

## 2021-10-10 DIAGNOSIS — M5136 Other intervertebral disc degeneration, lumbar region: Secondary | ICD-10-CM | POA: Diagnosis not present

## 2021-10-10 DIAGNOSIS — M47816 Spondylosis without myelopathy or radiculopathy, lumbar region: Secondary | ICD-10-CM | POA: Diagnosis not present

## 2021-10-11 DIAGNOSIS — M4726 Other spondylosis with radiculopathy, lumbar region: Secondary | ICD-10-CM | POA: Diagnosis not present

## 2021-10-11 DIAGNOSIS — M48061 Spinal stenosis, lumbar region without neurogenic claudication: Secondary | ICD-10-CM | POA: Diagnosis not present

## 2021-10-11 DIAGNOSIS — M5136 Other intervertebral disc degeneration, lumbar region: Secondary | ICD-10-CM | POA: Diagnosis not present

## 2021-10-14 ENCOUNTER — Other Ambulatory Visit: Payer: Self-pay | Admitting: Psychiatry

## 2021-10-23 ENCOUNTER — Encounter: Payer: Self-pay | Admitting: Neurology

## 2021-11-08 DIAGNOSIS — M1711 Unilateral primary osteoarthritis, right knee: Secondary | ICD-10-CM | POA: Diagnosis not present

## 2021-11-08 DIAGNOSIS — M25561 Pain in right knee: Secondary | ICD-10-CM | POA: Diagnosis not present

## 2021-11-13 DIAGNOSIS — R262 Difficulty in walking, not elsewhere classified: Secondary | ICD-10-CM | POA: Diagnosis not present

## 2021-11-13 DIAGNOSIS — M1731 Unilateral post-traumatic osteoarthritis, right knee: Secondary | ICD-10-CM | POA: Diagnosis not present

## 2021-11-13 DIAGNOSIS — M25661 Stiffness of right knee, not elsewhere classified: Secondary | ICD-10-CM | POA: Diagnosis not present

## 2021-11-15 DIAGNOSIS — M48062 Spinal stenosis, lumbar region with neurogenic claudication: Secondary | ICD-10-CM | POA: Diagnosis not present

## 2021-11-15 DIAGNOSIS — G894 Chronic pain syndrome: Secondary | ICD-10-CM | POA: Diagnosis not present

## 2021-11-15 DIAGNOSIS — M48061 Spinal stenosis, lumbar region without neurogenic claudication: Secondary | ICD-10-CM | POA: Diagnosis not present

## 2021-11-15 DIAGNOSIS — M4726 Other spondylosis with radiculopathy, lumbar region: Secondary | ICD-10-CM | POA: Diagnosis not present

## 2021-11-15 DIAGNOSIS — M5136 Other intervertebral disc degeneration, lumbar region: Secondary | ICD-10-CM | POA: Diagnosis not present

## 2021-11-17 DIAGNOSIS — G8918 Other acute postprocedural pain: Secondary | ICD-10-CM | POA: Diagnosis not present

## 2021-11-17 DIAGNOSIS — M1711 Unilateral primary osteoarthritis, right knee: Secondary | ICD-10-CM | POA: Diagnosis not present

## 2021-11-17 DIAGNOSIS — M21061 Valgus deformity, not elsewhere classified, right knee: Secondary | ICD-10-CM | POA: Diagnosis not present

## 2021-11-17 DIAGNOSIS — Z96651 Presence of right artificial knee joint: Secondary | ICD-10-CM | POA: Diagnosis not present

## 2021-11-20 DIAGNOSIS — M25661 Stiffness of right knee, not elsewhere classified: Secondary | ICD-10-CM | POA: Diagnosis not present

## 2021-11-20 DIAGNOSIS — M6281 Muscle weakness (generalized): Secondary | ICD-10-CM | POA: Diagnosis not present

## 2021-11-20 DIAGNOSIS — Z96651 Presence of right artificial knee joint: Secondary | ICD-10-CM | POA: Diagnosis not present

## 2021-11-22 DIAGNOSIS — M25661 Stiffness of right knee, not elsewhere classified: Secondary | ICD-10-CM | POA: Diagnosis not present

## 2021-11-22 DIAGNOSIS — Z96651 Presence of right artificial knee joint: Secondary | ICD-10-CM | POA: Diagnosis not present

## 2021-11-22 DIAGNOSIS — M6281 Muscle weakness (generalized): Secondary | ICD-10-CM | POA: Diagnosis not present

## 2021-11-27 DIAGNOSIS — M6281 Muscle weakness (generalized): Secondary | ICD-10-CM | POA: Diagnosis not present

## 2021-11-27 DIAGNOSIS — Z96651 Presence of right artificial knee joint: Secondary | ICD-10-CM | POA: Diagnosis not present

## 2021-11-27 DIAGNOSIS — M25661 Stiffness of right knee, not elsewhere classified: Secondary | ICD-10-CM | POA: Diagnosis not present

## 2021-11-28 DIAGNOSIS — M1711 Unilateral primary osteoarthritis, right knee: Secondary | ICD-10-CM | POA: Diagnosis not present

## 2021-11-29 DIAGNOSIS — M6281 Muscle weakness (generalized): Secondary | ICD-10-CM | POA: Diagnosis not present

## 2021-11-29 DIAGNOSIS — Z96651 Presence of right artificial knee joint: Secondary | ICD-10-CM | POA: Diagnosis not present

## 2021-11-29 DIAGNOSIS — M25661 Stiffness of right knee, not elsewhere classified: Secondary | ICD-10-CM | POA: Diagnosis not present

## 2021-12-06 DIAGNOSIS — Z96651 Presence of right artificial knee joint: Secondary | ICD-10-CM | POA: Diagnosis not present

## 2021-12-06 DIAGNOSIS — M25661 Stiffness of right knee, not elsewhere classified: Secondary | ICD-10-CM | POA: Diagnosis not present

## 2021-12-06 DIAGNOSIS — M6281 Muscle weakness (generalized): Secondary | ICD-10-CM | POA: Diagnosis not present

## 2021-12-08 DIAGNOSIS — M25661 Stiffness of right knee, not elsewhere classified: Secondary | ICD-10-CM | POA: Diagnosis not present

## 2021-12-08 DIAGNOSIS — Z96651 Presence of right artificial knee joint: Secondary | ICD-10-CM | POA: Diagnosis not present

## 2021-12-08 DIAGNOSIS — M6281 Muscle weakness (generalized): Secondary | ICD-10-CM | POA: Diagnosis not present

## 2021-12-11 DIAGNOSIS — M25661 Stiffness of right knee, not elsewhere classified: Secondary | ICD-10-CM | POA: Diagnosis not present

## 2021-12-11 DIAGNOSIS — M6281 Muscle weakness (generalized): Secondary | ICD-10-CM | POA: Diagnosis not present

## 2021-12-11 DIAGNOSIS — Z96651 Presence of right artificial knee joint: Secondary | ICD-10-CM | POA: Diagnosis not present

## 2021-12-15 DIAGNOSIS — E7800 Pure hypercholesterolemia, unspecified: Secondary | ICD-10-CM | POA: Diagnosis not present

## 2021-12-15 DIAGNOSIS — M48062 Spinal stenosis, lumbar region with neurogenic claudication: Secondary | ICD-10-CM | POA: Diagnosis not present

## 2021-12-15 DIAGNOSIS — I10 Essential (primary) hypertension: Secondary | ICD-10-CM | POA: Diagnosis not present

## 2021-12-15 DIAGNOSIS — M797 Fibromyalgia: Secondary | ICD-10-CM | POA: Diagnosis not present

## 2021-12-15 DIAGNOSIS — Z0000 Encounter for general adult medical examination without abnormal findings: Secondary | ICD-10-CM | POA: Diagnosis not present

## 2021-12-15 DIAGNOSIS — R600 Localized edema: Secondary | ICD-10-CM | POA: Diagnosis not present

## 2021-12-15 DIAGNOSIS — E1169 Type 2 diabetes mellitus with other specified complication: Secondary | ICD-10-CM | POA: Diagnosis not present

## 2021-12-18 DIAGNOSIS — M6281 Muscle weakness (generalized): Secondary | ICD-10-CM | POA: Diagnosis not present

## 2021-12-18 DIAGNOSIS — Z96651 Presence of right artificial knee joint: Secondary | ICD-10-CM | POA: Diagnosis not present

## 2021-12-18 DIAGNOSIS — M25661 Stiffness of right knee, not elsewhere classified: Secondary | ICD-10-CM | POA: Diagnosis not present

## 2021-12-27 DIAGNOSIS — M25661 Stiffness of right knee, not elsewhere classified: Secondary | ICD-10-CM | POA: Diagnosis not present

## 2021-12-27 DIAGNOSIS — Z96651 Presence of right artificial knee joint: Secondary | ICD-10-CM | POA: Diagnosis not present

## 2021-12-27 DIAGNOSIS — M6281 Muscle weakness (generalized): Secondary | ICD-10-CM | POA: Diagnosis not present

## 2021-12-29 DIAGNOSIS — Z96651 Presence of right artificial knee joint: Secondary | ICD-10-CM | POA: Diagnosis not present

## 2021-12-29 DIAGNOSIS — M25661 Stiffness of right knee, not elsewhere classified: Secondary | ICD-10-CM | POA: Diagnosis not present

## 2021-12-29 DIAGNOSIS — M6281 Muscle weakness (generalized): Secondary | ICD-10-CM | POA: Diagnosis not present

## 2022-01-05 DIAGNOSIS — Z96651 Presence of right artificial knee joint: Secondary | ICD-10-CM | POA: Diagnosis not present

## 2022-01-05 DIAGNOSIS — M25661 Stiffness of right knee, not elsewhere classified: Secondary | ICD-10-CM | POA: Diagnosis not present

## 2022-01-05 DIAGNOSIS — M6281 Muscle weakness (generalized): Secondary | ICD-10-CM | POA: Diagnosis not present

## 2022-01-08 DIAGNOSIS — Z1231 Encounter for screening mammogram for malignant neoplasm of breast: Secondary | ICD-10-CM | POA: Diagnosis not present

## 2022-01-10 DIAGNOSIS — M25661 Stiffness of right knee, not elsewhere classified: Secondary | ICD-10-CM | POA: Diagnosis not present

## 2022-01-10 DIAGNOSIS — Z96651 Presence of right artificial knee joint: Secondary | ICD-10-CM | POA: Diagnosis not present

## 2022-01-10 DIAGNOSIS — M6281 Muscle weakness (generalized): Secondary | ICD-10-CM | POA: Diagnosis not present

## 2022-02-02 DIAGNOSIS — M4726 Other spondylosis with radiculopathy, lumbar region: Secondary | ICD-10-CM | POA: Diagnosis not present

## 2022-02-02 DIAGNOSIS — M48061 Spinal stenosis, lumbar region without neurogenic claudication: Secondary | ICD-10-CM | POA: Diagnosis not present

## 2022-02-02 DIAGNOSIS — M5136 Other intervertebral disc degeneration, lumbar region: Secondary | ICD-10-CM | POA: Diagnosis not present

## 2022-02-06 DIAGNOSIS — Z96651 Presence of right artificial knee joint: Secondary | ICD-10-CM | POA: Diagnosis not present

## 2022-02-12 IMAGING — MR MR LUMBAR SPINE W/O CM
4 of 8 series · 15 of 48 positions shown · non-contrast
Comparison: 10/14/2019

CLINICAL DATA: Spinal stenosis of lumbosacral region. Pain in the
low back, hips, buttocks, and both legs with weakness as well as
numbness in the feet.

EXAM:
MRI LUMBAR SPINE WITHOUT CONTRAST
TECHNIQUE: Multiplanar, multisequence MR imaging of the lumbar spine was
performed. No intravenous contrast was administered.

[Series 5: T2 · sagittal · 4.0mm · 0.73mm/px · 4 of 15 slices shown (1 of 4)]
[im 1/15]
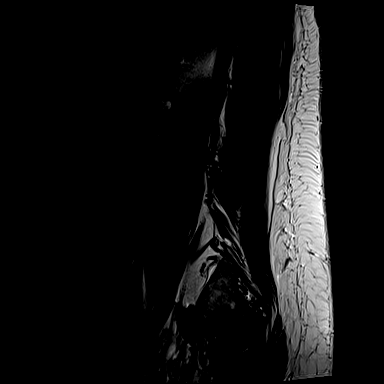
[im 5/15]
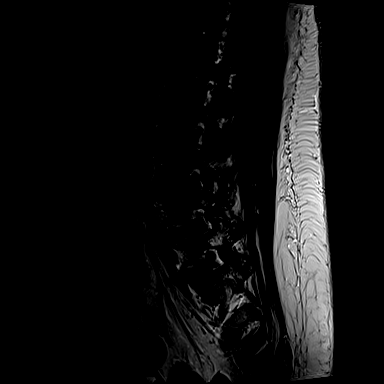
[im 10/15]
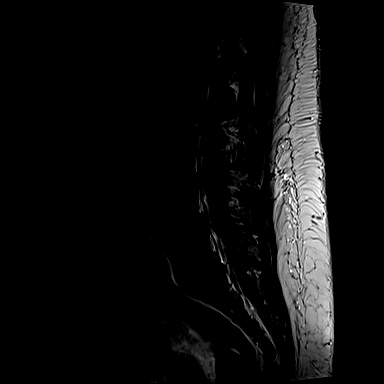
[im 15/15]
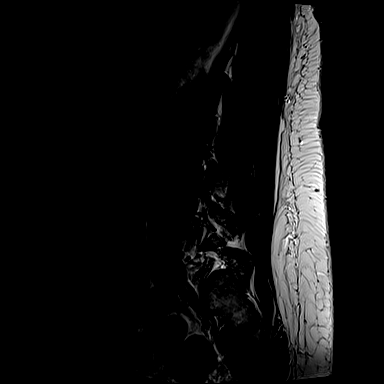

[Series 10: T2 · axial · 4.0mm · 0.28mm/px · z∈[+91,+181]mm · 5 of 19 slices shown (2 of 4)]
[im 1/19]
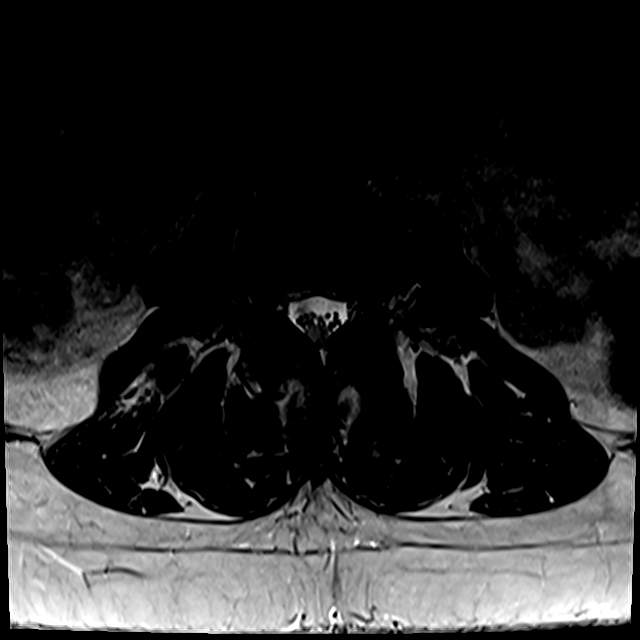
[im 4/19]
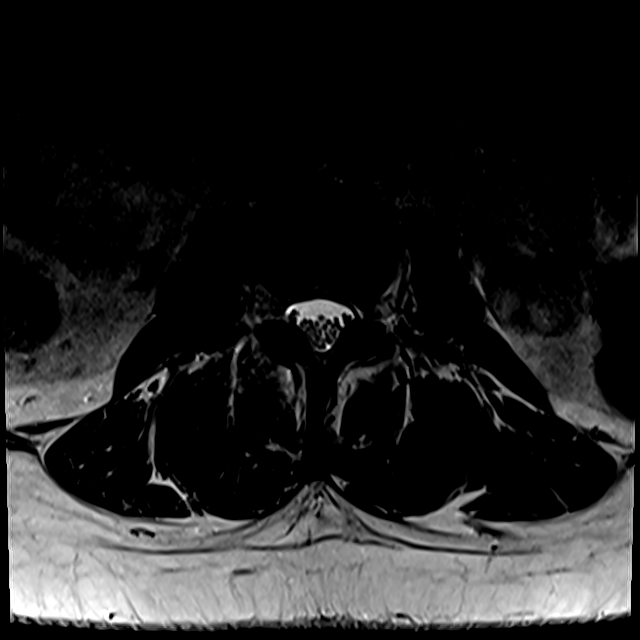
[im 8/19]
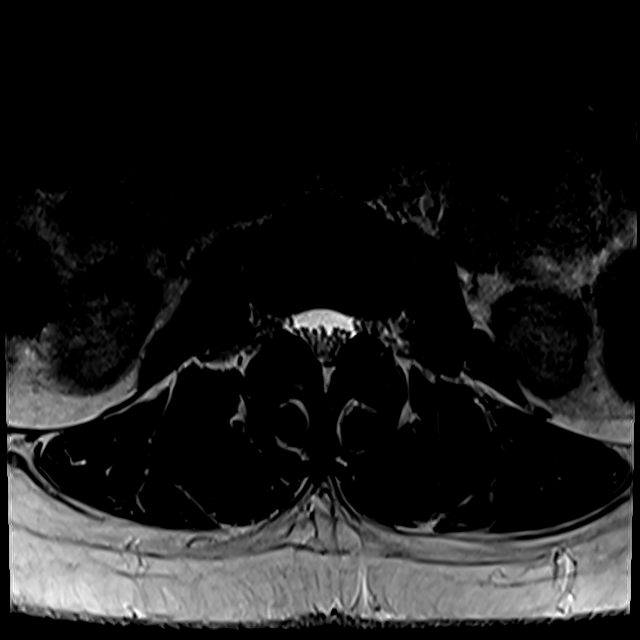
[im 11/19]
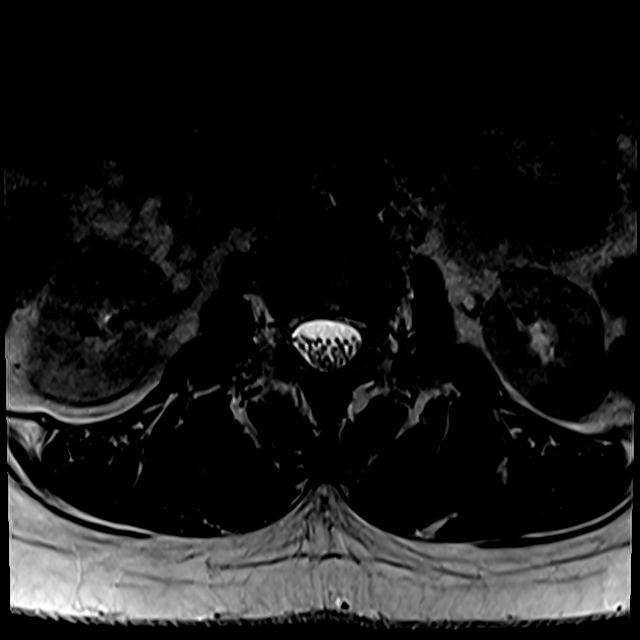
[im 19/19]
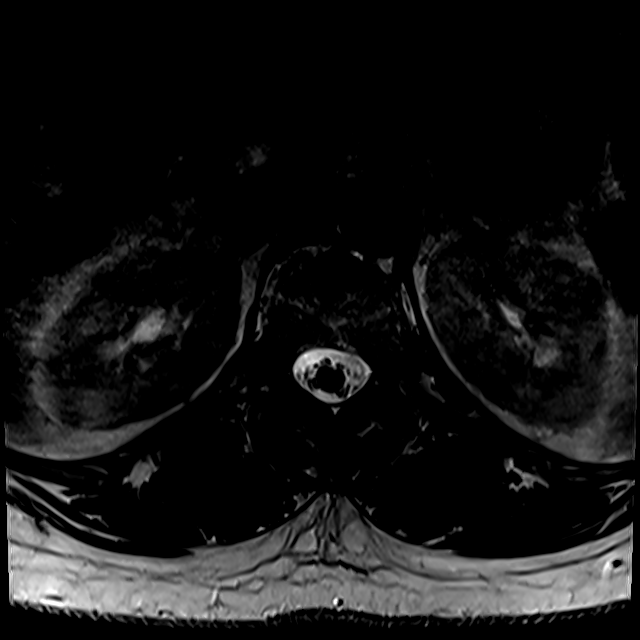

[Series 11: T2 · axial · 4.0mm · 0.28mm/px · z∈[-5,+74]mm · 3 of 20 slices shown (3 of 4)]
[im 4/20]
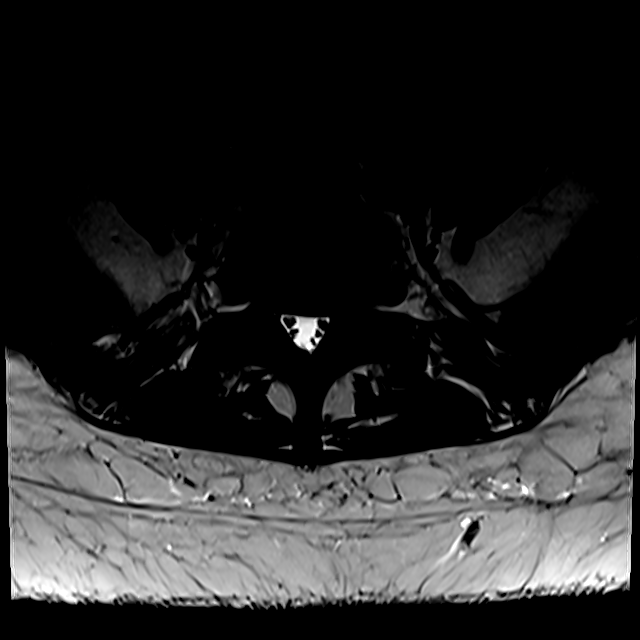
[im 12/20]
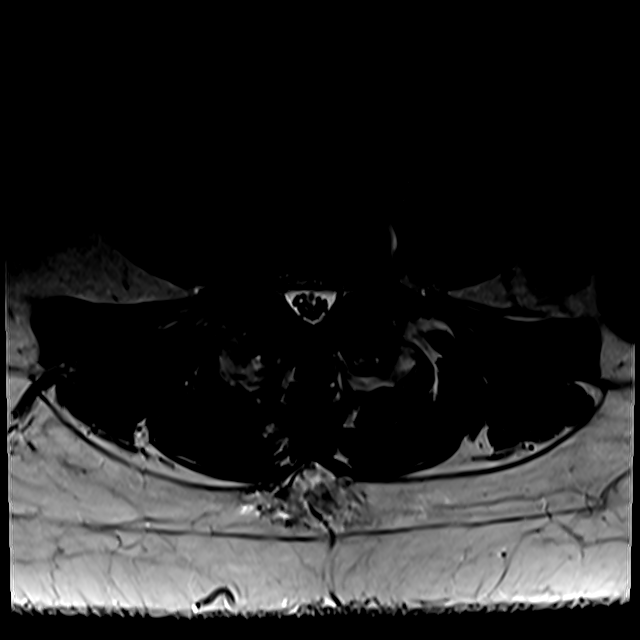
[im 20/20]
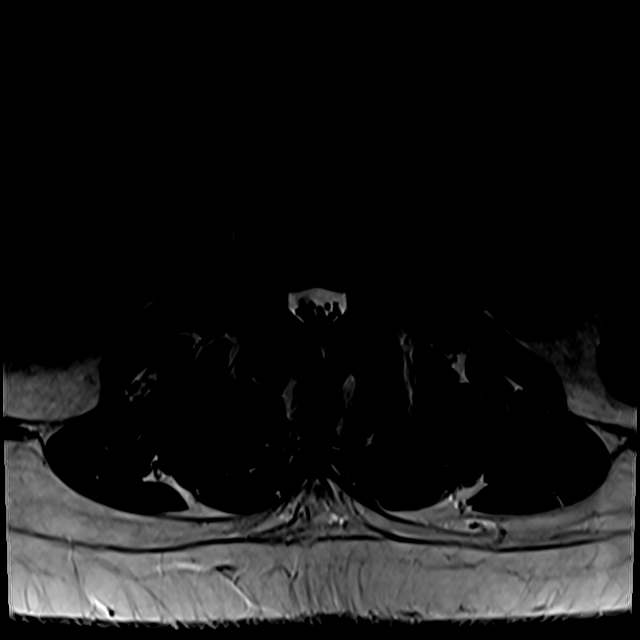

[Series 12: T2 · axial · 4.0mm · 0.28mm/px · z∈[+10,+161]mm · 3 of 39 slices shown (4 of 4)]
[im 7/39]
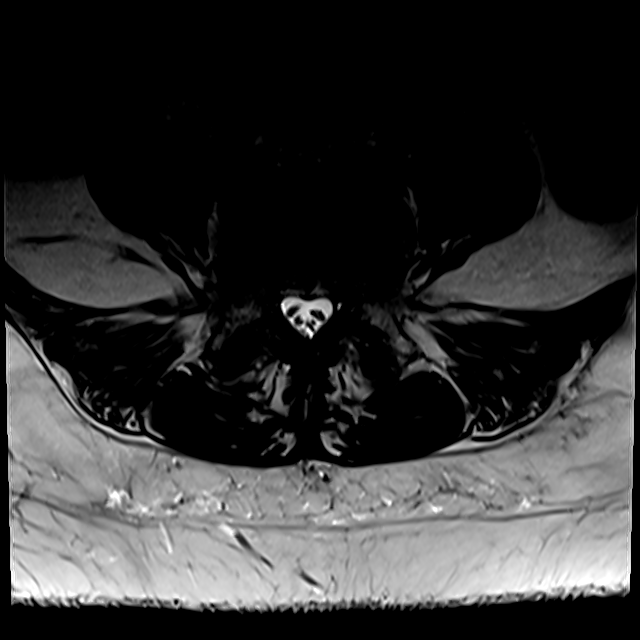
[im 21/39]
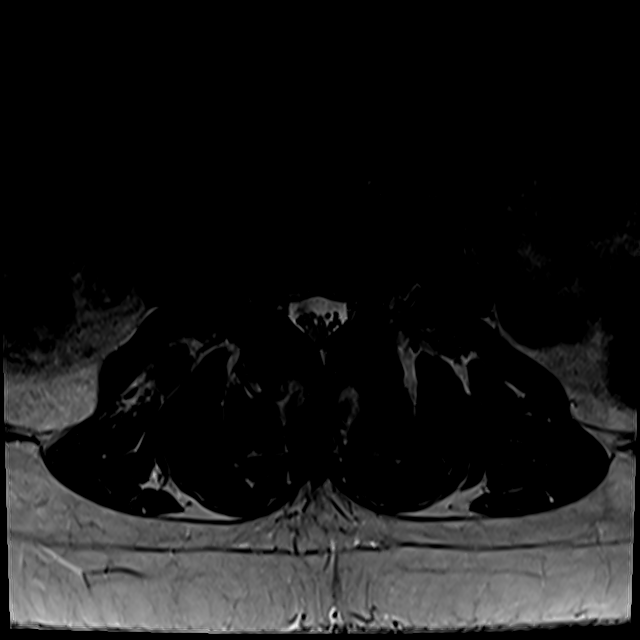
[im 35/39]
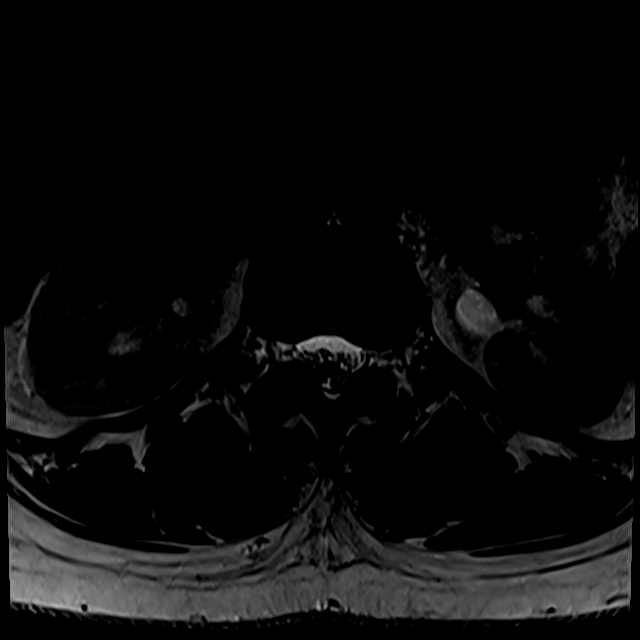

[15 of 48 positions shown; findings below may reference images not displayed]

FINDINGS: Segmentation:  Standard.

Alignment:  Unchanged trace anterolisthesis of L4 on L5.

Vertebrae: No fracture or suspicious marrow lesion. Bilateral facet
edema at L3-4, increased on the right and decreased on the left
compared to the prior MRI.

Conus medullaris and cauda equina: Conus extends to the L1 level.
Conus and cauda equina appear normal.

Paraspinal and other soft tissues: Unremarkable.

Disc levels:

Disc desiccation at T11-12, T12-L1, L3-4, and L4-5.

T11-12: Only imaged sagittally. Minimal disc bulging and mild facet
arthrosis without evidence of significant stenosis.

T12-L1: Mild-to-moderate right and mild left facet hypertrophy
without disc herniation or stenosis.

L1-2: Mild-to-moderate facet hypertrophy without disc herniation or
stenosis, unchanged.

L2-3: Moderate facet and ligamentum flavum hypertrophy without disc
herniation or stenosis, unchanged.

L3-4: Disc bulging and severe facet and ligamentum flavum
hypertrophy result in moderate spinal stenosis and mild to moderate
bilateral neural foraminal stenosis, unchanged. There are bilateral
facet joint effusions which can be seen in the setting of
instability.

L4-5: Anterolisthesis with bulging uncovered disc and severe facet
hypertrophy result in severe spinal stenosis and mild-to-moderate
bilateral neural foraminal stenosis, unchanged. There are bilateral
facet joint effusions which can be seen in the setting of
instability.

L5-S1: Severe facet hypertrophy and mild ligamentum flavum
hypertrophy result in moderate bilateral lateral recess stenosis and
mild bilateral proximal neural foraminal stenosis without spinal
stenosis, unchanged.
IMPRESSION: Unchanged disc and advanced facet degeneration with severe spinal
stenosis at L4-5 and moderate spinal stenosis at L3-4.

## 2022-03-20 DIAGNOSIS — M4726 Other spondylosis with radiculopathy, lumbar region: Secondary | ICD-10-CM | POA: Diagnosis not present

## 2022-03-20 DIAGNOSIS — G894 Chronic pain syndrome: Secondary | ICD-10-CM | POA: Diagnosis not present

## 2022-03-20 DIAGNOSIS — M48062 Spinal stenosis, lumbar region with neurogenic claudication: Secondary | ICD-10-CM | POA: Diagnosis not present

## 2022-03-20 DIAGNOSIS — M5136 Other intervertebral disc degeneration, lumbar region: Secondary | ICD-10-CM | POA: Diagnosis not present

## 2022-03-20 DIAGNOSIS — M48061 Spinal stenosis, lumbar region without neurogenic claudication: Secondary | ICD-10-CM | POA: Diagnosis not present

## 2022-05-07 DIAGNOSIS — G894 Chronic pain syndrome: Secondary | ICD-10-CM | POA: Diagnosis not present

## 2022-05-07 DIAGNOSIS — M5116 Intervertebral disc disorders with radiculopathy, lumbar region: Secondary | ICD-10-CM | POA: Diagnosis not present

## 2022-05-07 DIAGNOSIS — M4726 Other spondylosis with radiculopathy, lumbar region: Secondary | ICD-10-CM | POA: Diagnosis not present

## 2022-05-07 DIAGNOSIS — M48062 Spinal stenosis, lumbar region with neurogenic claudication: Secondary | ICD-10-CM | POA: Diagnosis not present

## 2022-05-15 DIAGNOSIS — M1712 Unilateral primary osteoarthritis, left knee: Secondary | ICD-10-CM | POA: Diagnosis not present

## 2022-05-23 DIAGNOSIS — J40 Bronchitis, not specified as acute or chronic: Secondary | ICD-10-CM | POA: Diagnosis not present

## 2022-05-23 DIAGNOSIS — L309 Dermatitis, unspecified: Secondary | ICD-10-CM | POA: Diagnosis not present

## 2022-05-23 DIAGNOSIS — R058 Other specified cough: Secondary | ICD-10-CM | POA: Diagnosis not present

## 2022-06-18 DIAGNOSIS — R202 Paresthesia of skin: Secondary | ICD-10-CM | POA: Diagnosis not present

## 2022-06-18 DIAGNOSIS — M79604 Pain in right leg: Secondary | ICD-10-CM | POA: Diagnosis not present

## 2022-06-18 DIAGNOSIS — M48062 Spinal stenosis, lumbar region with neurogenic claudication: Secondary | ICD-10-CM | POA: Diagnosis not present

## 2022-06-18 DIAGNOSIS — M48061 Spinal stenosis, lumbar region without neurogenic claudication: Secondary | ICD-10-CM | POA: Diagnosis not present

## 2022-06-18 DIAGNOSIS — G8929 Other chronic pain: Secondary | ICD-10-CM | POA: Diagnosis not present

## 2022-06-18 DIAGNOSIS — M5136 Other intervertebral disc degeneration, lumbar region: Secondary | ICD-10-CM | POA: Diagnosis not present

## 2022-06-18 DIAGNOSIS — M4726 Other spondylosis with radiculopathy, lumbar region: Secondary | ICD-10-CM | POA: Diagnosis not present

## 2022-06-19 DIAGNOSIS — E7800 Pure hypercholesterolemia, unspecified: Secondary | ICD-10-CM | POA: Diagnosis not present

## 2022-06-19 DIAGNOSIS — E1169 Type 2 diabetes mellitus with other specified complication: Secondary | ICD-10-CM | POA: Diagnosis not present

## 2022-06-19 DIAGNOSIS — I10 Essential (primary) hypertension: Secondary | ICD-10-CM | POA: Diagnosis not present

## 2022-06-20 DIAGNOSIS — I10 Essential (primary) hypertension: Secondary | ICD-10-CM | POA: Diagnosis not present

## 2022-06-20 DIAGNOSIS — E119 Type 2 diabetes mellitus without complications: Secondary | ICD-10-CM | POA: Diagnosis not present

## 2022-06-20 DIAGNOSIS — R600 Localized edema: Secondary | ICD-10-CM | POA: Diagnosis not present

## 2022-06-20 DIAGNOSIS — M48062 Spinal stenosis, lumbar region with neurogenic claudication: Secondary | ICD-10-CM | POA: Diagnosis not present

## 2022-06-20 DIAGNOSIS — M1712 Unilateral primary osteoarthritis, left knee: Secondary | ICD-10-CM | POA: Diagnosis not present

## 2022-06-20 DIAGNOSIS — E7800 Pure hypercholesterolemia, unspecified: Secondary | ICD-10-CM | POA: Diagnosis not present

## 2022-06-20 DIAGNOSIS — E1169 Type 2 diabetes mellitus with other specified complication: Secondary | ICD-10-CM | POA: Diagnosis not present

## 2022-06-20 DIAGNOSIS — Z9989 Dependence on other enabling machines and devices: Secondary | ICD-10-CM | POA: Diagnosis not present

## 2022-06-29 DIAGNOSIS — Z96652 Presence of left artificial knee joint: Secondary | ICD-10-CM | POA: Diagnosis not present

## 2022-06-29 DIAGNOSIS — G8918 Other acute postprocedural pain: Secondary | ICD-10-CM | POA: Diagnosis not present

## 2022-06-29 DIAGNOSIS — M21162 Varus deformity, not elsewhere classified, left knee: Secondary | ICD-10-CM | POA: Diagnosis not present

## 2022-06-29 DIAGNOSIS — M1712 Unilateral primary osteoarthritis, left knee: Secondary | ICD-10-CM | POA: Diagnosis not present

## 2022-07-02 DIAGNOSIS — Z96652 Presence of left artificial knee joint: Secondary | ICD-10-CM | POA: Diagnosis not present

## 2022-07-02 DIAGNOSIS — M6281 Muscle weakness (generalized): Secondary | ICD-10-CM | POA: Diagnosis not present

## 2022-07-02 DIAGNOSIS — M25662 Stiffness of left knee, not elsewhere classified: Secondary | ICD-10-CM | POA: Diagnosis not present

## 2022-07-05 DIAGNOSIS — M6281 Muscle weakness (generalized): Secondary | ICD-10-CM | POA: Diagnosis not present

## 2022-07-05 DIAGNOSIS — Z96652 Presence of left artificial knee joint: Secondary | ICD-10-CM | POA: Diagnosis not present

## 2022-07-05 DIAGNOSIS — M25662 Stiffness of left knee, not elsewhere classified: Secondary | ICD-10-CM | POA: Diagnosis not present

## 2022-07-09 DIAGNOSIS — M25662 Stiffness of left knee, not elsewhere classified: Secondary | ICD-10-CM | POA: Diagnosis not present

## 2022-07-09 DIAGNOSIS — Z96652 Presence of left artificial knee joint: Secondary | ICD-10-CM | POA: Diagnosis not present

## 2022-07-09 DIAGNOSIS — M6281 Muscle weakness (generalized): Secondary | ICD-10-CM | POA: Diagnosis not present

## 2022-07-10 DIAGNOSIS — Z96652 Presence of left artificial knee joint: Secondary | ICD-10-CM | POA: Diagnosis not present

## 2022-07-10 DIAGNOSIS — M25662 Stiffness of left knee, not elsewhere classified: Secondary | ICD-10-CM | POA: Diagnosis not present

## 2022-07-10 DIAGNOSIS — Z9889 Other specified postprocedural states: Secondary | ICD-10-CM | POA: Diagnosis not present

## 2022-07-11 DIAGNOSIS — Z96652 Presence of left artificial knee joint: Secondary | ICD-10-CM | POA: Diagnosis not present

## 2022-07-11 DIAGNOSIS — M25662 Stiffness of left knee, not elsewhere classified: Secondary | ICD-10-CM | POA: Diagnosis not present

## 2022-07-11 DIAGNOSIS — M6281 Muscle weakness (generalized): Secondary | ICD-10-CM | POA: Diagnosis not present

## 2022-07-17 DIAGNOSIS — M25662 Stiffness of left knee, not elsewhere classified: Secondary | ICD-10-CM | POA: Diagnosis not present

## 2022-07-17 DIAGNOSIS — Z96652 Presence of left artificial knee joint: Secondary | ICD-10-CM | POA: Diagnosis not present

## 2022-07-17 DIAGNOSIS — M6281 Muscle weakness (generalized): Secondary | ICD-10-CM | POA: Diagnosis not present

## 2022-07-19 DIAGNOSIS — M25662 Stiffness of left knee, not elsewhere classified: Secondary | ICD-10-CM | POA: Diagnosis not present

## 2022-07-19 DIAGNOSIS — Z96652 Presence of left artificial knee joint: Secondary | ICD-10-CM | POA: Diagnosis not present

## 2022-07-19 DIAGNOSIS — M6281 Muscle weakness (generalized): Secondary | ICD-10-CM | POA: Diagnosis not present

## 2022-07-24 DIAGNOSIS — M6281 Muscle weakness (generalized): Secondary | ICD-10-CM | POA: Diagnosis not present

## 2022-07-24 DIAGNOSIS — M25662 Stiffness of left knee, not elsewhere classified: Secondary | ICD-10-CM | POA: Diagnosis not present

## 2022-07-24 DIAGNOSIS — Z96652 Presence of left artificial knee joint: Secondary | ICD-10-CM | POA: Diagnosis not present

## 2022-07-26 DIAGNOSIS — Z96652 Presence of left artificial knee joint: Secondary | ICD-10-CM | POA: Diagnosis not present

## 2022-07-26 DIAGNOSIS — M6281 Muscle weakness (generalized): Secondary | ICD-10-CM | POA: Diagnosis not present

## 2022-07-26 DIAGNOSIS — M25662 Stiffness of left knee, not elsewhere classified: Secondary | ICD-10-CM | POA: Diagnosis not present

## 2022-09-25 DIAGNOSIS — H04123 Dry eye syndrome of bilateral lacrimal glands: Secondary | ICD-10-CM | POA: Diagnosis not present

## 2022-09-25 DIAGNOSIS — H25813 Combined forms of age-related cataract, bilateral: Secondary | ICD-10-CM | POA: Diagnosis not present

## 2022-10-08 DIAGNOSIS — M4724 Other spondylosis with radiculopathy, thoracic region: Secondary | ICD-10-CM | POA: Diagnosis not present

## 2022-10-08 DIAGNOSIS — M79604 Pain in right leg: Secondary | ICD-10-CM | POA: Diagnosis not present

## 2022-10-08 DIAGNOSIS — M5114 Intervertebral disc disorders with radiculopathy, thoracic region: Secondary | ICD-10-CM | POA: Diagnosis not present

## 2022-10-08 DIAGNOSIS — M5116 Intervertebral disc disorders with radiculopathy, lumbar region: Secondary | ICD-10-CM | POA: Diagnosis not present

## 2022-10-08 DIAGNOSIS — M5115 Intervertebral disc disorders with radiculopathy, thoracolumbar region: Secondary | ICD-10-CM | POA: Diagnosis not present

## 2022-10-08 DIAGNOSIS — R202 Paresthesia of skin: Secondary | ICD-10-CM | POA: Diagnosis not present

## 2022-10-08 DIAGNOSIS — G8929 Other chronic pain: Secondary | ICD-10-CM | POA: Diagnosis not present

## 2022-10-08 DIAGNOSIS — M5117 Intervertebral disc disorders with radiculopathy, lumbosacral region: Secondary | ICD-10-CM | POA: Diagnosis not present

## 2022-10-08 DIAGNOSIS — M4804 Spinal stenosis, thoracic region: Secondary | ICD-10-CM | POA: Diagnosis not present

## 2022-10-08 DIAGNOSIS — M4726 Other spondylosis with radiculopathy, lumbar region: Secondary | ICD-10-CM | POA: Diagnosis not present

## 2022-10-08 DIAGNOSIS — M4725 Other spondylosis with radiculopathy, thoracolumbar region: Secondary | ICD-10-CM | POA: Diagnosis not present

## 2022-10-08 DIAGNOSIS — M4314 Spondylolisthesis, thoracic region: Secondary | ICD-10-CM | POA: Diagnosis not present

## 2022-10-22 DIAGNOSIS — M4804 Spinal stenosis, thoracic region: Secondary | ICD-10-CM | POA: Diagnosis not present

## 2022-10-22 DIAGNOSIS — M48062 Spinal stenosis, lumbar region with neurogenic claudication: Secondary | ICD-10-CM | POA: Diagnosis not present

## 2022-12-05 DIAGNOSIS — Z01812 Encounter for preprocedural laboratory examination: Secondary | ICD-10-CM | POA: Diagnosis not present

## 2022-12-05 DIAGNOSIS — Z79899 Other long term (current) drug therapy: Secondary | ICD-10-CM | POA: Diagnosis not present

## 2022-12-05 DIAGNOSIS — E119 Type 2 diabetes mellitus without complications: Secondary | ICD-10-CM | POA: Diagnosis not present

## 2022-12-05 DIAGNOSIS — M4726 Other spondylosis with radiculopathy, lumbar region: Secondary | ICD-10-CM | POA: Diagnosis not present

## 2022-12-05 DIAGNOSIS — L439 Lichen planus, unspecified: Secondary | ICD-10-CM | POA: Diagnosis not present

## 2022-12-05 DIAGNOSIS — I10 Essential (primary) hypertension: Secondary | ICD-10-CM | POA: Diagnosis not present

## 2022-12-05 DIAGNOSIS — M4804 Spinal stenosis, thoracic region: Secondary | ICD-10-CM | POA: Diagnosis not present

## 2022-12-05 DIAGNOSIS — M48062 Spinal stenosis, lumbar region with neurogenic claudication: Secondary | ICD-10-CM | POA: Diagnosis not present

## 2022-12-17 DIAGNOSIS — M4805 Spinal stenosis, thoracolumbar region: Secondary | ICD-10-CM | POA: Diagnosis not present

## 2022-12-20 DIAGNOSIS — G709 Myoneural disorder, unspecified: Secondary | ICD-10-CM | POA: Diagnosis not present

## 2022-12-20 DIAGNOSIS — M5104 Intervertebral disc disorders with myelopathy, thoracic region: Secondary | ICD-10-CM | POA: Diagnosis not present

## 2022-12-20 DIAGNOSIS — M5416 Radiculopathy, lumbar region: Secondary | ICD-10-CM | POA: Diagnosis not present

## 2022-12-20 DIAGNOSIS — M4714 Other spondylosis with myelopathy, thoracic region: Secondary | ICD-10-CM | POA: Diagnosis not present

## 2022-12-20 DIAGNOSIS — E1169 Type 2 diabetes mellitus with other specified complication: Secondary | ICD-10-CM | POA: Diagnosis not present

## 2022-12-20 DIAGNOSIS — M48061 Spinal stenosis, lumbar region without neurogenic claudication: Secondary | ICD-10-CM | POA: Diagnosis not present

## 2022-12-20 DIAGNOSIS — I10 Essential (primary) hypertension: Secondary | ICD-10-CM | POA: Diagnosis not present

## 2022-12-20 DIAGNOSIS — M5116 Intervertebral disc disorders with radiculopathy, lumbar region: Secondary | ICD-10-CM | POA: Diagnosis not present

## 2022-12-20 DIAGNOSIS — M4805 Spinal stenosis, thoracolumbar region: Secondary | ICD-10-CM | POA: Diagnosis not present

## 2022-12-20 DIAGNOSIS — L439 Lichen planus, unspecified: Secondary | ICD-10-CM | POA: Diagnosis not present

## 2022-12-20 DIAGNOSIS — M4804 Spinal stenosis, thoracic region: Secondary | ICD-10-CM | POA: Diagnosis not present

## 2022-12-20 DIAGNOSIS — M1990 Unspecified osteoarthritis, unspecified site: Secondary | ICD-10-CM | POA: Diagnosis not present

## 2022-12-20 DIAGNOSIS — G9529 Other cord compression: Secondary | ICD-10-CM | POA: Diagnosis not present

## 2022-12-20 DIAGNOSIS — M48062 Spinal stenosis, lumbar region with neurogenic claudication: Secondary | ICD-10-CM | POA: Diagnosis not present

## 2022-12-21 DIAGNOSIS — G9529 Other cord compression: Secondary | ICD-10-CM | POA: Diagnosis not present

## 2022-12-21 DIAGNOSIS — M4804 Spinal stenosis, thoracic region: Secondary | ICD-10-CM | POA: Diagnosis not present

## 2022-12-21 DIAGNOSIS — M5104 Intervertebral disc disorders with myelopathy, thoracic region: Secondary | ICD-10-CM | POA: Diagnosis not present

## 2022-12-21 DIAGNOSIS — G709 Myoneural disorder, unspecified: Secondary | ICD-10-CM | POA: Diagnosis not present

## 2022-12-21 DIAGNOSIS — M5116 Intervertebral disc disorders with radiculopathy, lumbar region: Secondary | ICD-10-CM | POA: Diagnosis not present

## 2022-12-21 DIAGNOSIS — I10 Essential (primary) hypertension: Secondary | ICD-10-CM | POA: Diagnosis not present

## 2022-12-21 DIAGNOSIS — M1990 Unspecified osteoarthritis, unspecified site: Secondary | ICD-10-CM | POA: Diagnosis not present

## 2022-12-21 DIAGNOSIS — L439 Lichen planus, unspecified: Secondary | ICD-10-CM | POA: Diagnosis not present

## 2022-12-21 DIAGNOSIS — E1169 Type 2 diabetes mellitus with other specified complication: Secondary | ICD-10-CM | POA: Diagnosis not present

## 2022-12-21 DIAGNOSIS — M48062 Spinal stenosis, lumbar region with neurogenic claudication: Secondary | ICD-10-CM | POA: Diagnosis not present

## 2023-01-07 DIAGNOSIS — M4804 Spinal stenosis, thoracic region: Secondary | ICD-10-CM | POA: Diagnosis not present

## 2023-01-07 DIAGNOSIS — M48062 Spinal stenosis, lumbar region with neurogenic claudication: Secondary | ICD-10-CM | POA: Diagnosis not present

## 2023-01-07 DIAGNOSIS — Z09 Encounter for follow-up examination after completed treatment for conditions other than malignant neoplasm: Secondary | ICD-10-CM | POA: Diagnosis not present

## 2023-01-09 DIAGNOSIS — M4804 Spinal stenosis, thoracic region: Secondary | ICD-10-CM | POA: Diagnosis not present

## 2023-01-09 DIAGNOSIS — Z09 Encounter for follow-up examination after completed treatment for conditions other than malignant neoplasm: Secondary | ICD-10-CM | POA: Diagnosis not present

## 2023-01-09 DIAGNOSIS — M48062 Spinal stenosis, lumbar region with neurogenic claudication: Secondary | ICD-10-CM | POA: Diagnosis not present

## 2023-01-14 DIAGNOSIS — M48062 Spinal stenosis, lumbar region with neurogenic claudication: Secondary | ICD-10-CM | POA: Diagnosis not present

## 2023-01-14 DIAGNOSIS — M4804 Spinal stenosis, thoracic region: Secondary | ICD-10-CM | POA: Diagnosis not present

## 2023-01-14 DIAGNOSIS — Z09 Encounter for follow-up examination after completed treatment for conditions other than malignant neoplasm: Secondary | ICD-10-CM | POA: Diagnosis not present

## 2023-01-14 DIAGNOSIS — Z1231 Encounter for screening mammogram for malignant neoplasm of breast: Secondary | ICD-10-CM | POA: Diagnosis not present

## 2023-01-17 DIAGNOSIS — Z09 Encounter for follow-up examination after completed treatment for conditions other than malignant neoplasm: Secondary | ICD-10-CM | POA: Diagnosis not present

## 2023-01-17 DIAGNOSIS — M4804 Spinal stenosis, thoracic region: Secondary | ICD-10-CM | POA: Diagnosis not present

## 2023-01-17 DIAGNOSIS — M48062 Spinal stenosis, lumbar region with neurogenic claudication: Secondary | ICD-10-CM | POA: Diagnosis not present

## 2023-01-25 DIAGNOSIS — I10 Essential (primary) hypertension: Secondary | ICD-10-CM | POA: Diagnosis not present

## 2023-01-25 DIAGNOSIS — Z0000 Encounter for general adult medical examination without abnormal findings: Secondary | ICD-10-CM | POA: Diagnosis not present

## 2023-01-25 DIAGNOSIS — E119 Type 2 diabetes mellitus without complications: Secondary | ICD-10-CM | POA: Diagnosis not present

## 2023-01-25 DIAGNOSIS — E1169 Type 2 diabetes mellitus with other specified complication: Secondary | ICD-10-CM | POA: Diagnosis not present

## 2023-01-25 DIAGNOSIS — M48062 Spinal stenosis, lumbar region with neurogenic claudication: Secondary | ICD-10-CM | POA: Diagnosis not present

## 2023-01-25 DIAGNOSIS — M797 Fibromyalgia: Secondary | ICD-10-CM | POA: Diagnosis not present

## 2023-01-25 DIAGNOSIS — R600 Localized edema: Secondary | ICD-10-CM | POA: Diagnosis not present

## 2023-01-25 DIAGNOSIS — E7800 Pure hypercholesterolemia, unspecified: Secondary | ICD-10-CM | POA: Diagnosis not present

## 2023-01-28 DIAGNOSIS — M4804 Spinal stenosis, thoracic region: Secondary | ICD-10-CM | POA: Diagnosis not present

## 2023-01-28 DIAGNOSIS — M48062 Spinal stenosis, lumbar region with neurogenic claudication: Secondary | ICD-10-CM | POA: Diagnosis not present

## 2023-01-28 DIAGNOSIS — Z09 Encounter for follow-up examination after completed treatment for conditions other than malignant neoplasm: Secondary | ICD-10-CM | POA: Diagnosis not present

## 2023-01-31 DIAGNOSIS — M48062 Spinal stenosis, lumbar region with neurogenic claudication: Secondary | ICD-10-CM | POA: Diagnosis not present

## 2023-01-31 DIAGNOSIS — Z09 Encounter for follow-up examination after completed treatment for conditions other than malignant neoplasm: Secondary | ICD-10-CM | POA: Diagnosis not present

## 2023-01-31 DIAGNOSIS — M4804 Spinal stenosis, thoracic region: Secondary | ICD-10-CM | POA: Diagnosis not present

## 2023-02-04 DIAGNOSIS — M4804 Spinal stenosis, thoracic region: Secondary | ICD-10-CM | POA: Diagnosis not present

## 2023-02-04 DIAGNOSIS — Z09 Encounter for follow-up examination after completed treatment for conditions other than malignant neoplasm: Secondary | ICD-10-CM | POA: Diagnosis not present

## 2023-02-04 DIAGNOSIS — M48062 Spinal stenosis, lumbar region with neurogenic claudication: Secondary | ICD-10-CM | POA: Diagnosis not present

## 2023-02-06 DIAGNOSIS — M4804 Spinal stenosis, thoracic region: Secondary | ICD-10-CM | POA: Diagnosis not present

## 2023-02-06 DIAGNOSIS — Z09 Encounter for follow-up examination after completed treatment for conditions other than malignant neoplasm: Secondary | ICD-10-CM | POA: Diagnosis not present

## 2023-02-06 DIAGNOSIS — M48062 Spinal stenosis, lumbar region with neurogenic claudication: Secondary | ICD-10-CM | POA: Diagnosis not present

## 2023-02-13 DIAGNOSIS — Z09 Encounter for follow-up examination after completed treatment for conditions other than malignant neoplasm: Secondary | ICD-10-CM | POA: Diagnosis not present

## 2023-02-13 DIAGNOSIS — M4804 Spinal stenosis, thoracic region: Secondary | ICD-10-CM | POA: Diagnosis not present

## 2023-02-13 DIAGNOSIS — M48062 Spinal stenosis, lumbar region with neurogenic claudication: Secondary | ICD-10-CM | POA: Diagnosis not present

## 2023-02-19 DIAGNOSIS — M25661 Stiffness of right knee, not elsewhere classified: Secondary | ICD-10-CM | POA: Diagnosis not present

## 2023-02-19 DIAGNOSIS — M25662 Stiffness of left knee, not elsewhere classified: Secondary | ICD-10-CM | POA: Diagnosis not present

## 2023-02-19 DIAGNOSIS — Z09 Encounter for follow-up examination after completed treatment for conditions other than malignant neoplasm: Secondary | ICD-10-CM | POA: Diagnosis not present

## 2023-02-19 DIAGNOSIS — M48062 Spinal stenosis, lumbar region with neurogenic claudication: Secondary | ICD-10-CM | POA: Diagnosis not present

## 2023-02-19 DIAGNOSIS — M4804 Spinal stenosis, thoracic region: Secondary | ICD-10-CM | POA: Diagnosis not present

## 2023-02-21 DIAGNOSIS — M4804 Spinal stenosis, thoracic region: Secondary | ICD-10-CM | POA: Diagnosis not present

## 2023-02-21 DIAGNOSIS — Z09 Encounter for follow-up examination after completed treatment for conditions other than malignant neoplasm: Secondary | ICD-10-CM | POA: Diagnosis not present

## 2023-02-21 DIAGNOSIS — M48062 Spinal stenosis, lumbar region with neurogenic claudication: Secondary | ICD-10-CM | POA: Diagnosis not present

## 2023-02-27 DIAGNOSIS — M4804 Spinal stenosis, thoracic region: Secondary | ICD-10-CM | POA: Diagnosis not present

## 2023-02-27 DIAGNOSIS — M48062 Spinal stenosis, lumbar region with neurogenic claudication: Secondary | ICD-10-CM | POA: Diagnosis not present

## 2023-02-27 DIAGNOSIS — Z09 Encounter for follow-up examination after completed treatment for conditions other than malignant neoplasm: Secondary | ICD-10-CM | POA: Diagnosis not present

## 2023-04-24 DIAGNOSIS — J029 Acute pharyngitis, unspecified: Secondary | ICD-10-CM | POA: Diagnosis not present

## 2023-05-13 DIAGNOSIS — M4804 Spinal stenosis, thoracic region: Secondary | ICD-10-CM | POA: Diagnosis not present

## 2023-05-13 DIAGNOSIS — G992 Myelopathy in diseases classified elsewhere: Secondary | ICD-10-CM | POA: Diagnosis not present

## 2023-05-13 DIAGNOSIS — M48062 Spinal stenosis, lumbar region with neurogenic claudication: Secondary | ICD-10-CM | POA: Diagnosis not present

## 2023-07-26 DIAGNOSIS — I10 Essential (primary) hypertension: Secondary | ICD-10-CM | POA: Diagnosis not present

## 2023-07-26 DIAGNOSIS — R600 Localized edema: Secondary | ICD-10-CM | POA: Diagnosis not present

## 2023-07-26 DIAGNOSIS — G8929 Other chronic pain: Secondary | ICD-10-CM | POA: Diagnosis not present

## 2023-07-26 DIAGNOSIS — M797 Fibromyalgia: Secondary | ICD-10-CM | POA: Diagnosis not present

## 2023-07-26 DIAGNOSIS — E119 Type 2 diabetes mellitus without complications: Secondary | ICD-10-CM | POA: Diagnosis not present

## 2023-07-26 DIAGNOSIS — E7800 Pure hypercholesterolemia, unspecified: Secondary | ICD-10-CM | POA: Diagnosis not present

## 2023-09-10 DIAGNOSIS — M7061 Trochanteric bursitis, right hip: Secondary | ICD-10-CM | POA: Diagnosis not present

## 2023-09-11 DIAGNOSIS — M7061 Trochanteric bursitis, right hip: Secondary | ICD-10-CM | POA: Diagnosis not present

## 2023-09-11 DIAGNOSIS — R262 Difficulty in walking, not elsewhere classified: Secondary | ICD-10-CM | POA: Diagnosis not present

## 2023-09-17 DIAGNOSIS — R262 Difficulty in walking, not elsewhere classified: Secondary | ICD-10-CM | POA: Diagnosis not present

## 2023-09-17 DIAGNOSIS — M7061 Trochanteric bursitis, right hip: Secondary | ICD-10-CM | POA: Diagnosis not present

## 2023-09-23 DIAGNOSIS — G992 Myelopathy in diseases classified elsewhere: Secondary | ICD-10-CM | POA: Diagnosis not present

## 2023-09-23 DIAGNOSIS — M4804 Spinal stenosis, thoracic region: Secondary | ICD-10-CM | POA: Diagnosis not present

## 2023-09-23 DIAGNOSIS — M48062 Spinal stenosis, lumbar region with neurogenic claudication: Secondary | ICD-10-CM | POA: Diagnosis not present

## 2023-09-24 DIAGNOSIS — R262 Difficulty in walking, not elsewhere classified: Secondary | ICD-10-CM | POA: Diagnosis not present

## 2023-09-24 DIAGNOSIS — M7061 Trochanteric bursitis, right hip: Secondary | ICD-10-CM | POA: Diagnosis not present

## 2023-10-01 DIAGNOSIS — M7061 Trochanteric bursitis, right hip: Secondary | ICD-10-CM | POA: Diagnosis not present

## 2023-10-01 DIAGNOSIS — R262 Difficulty in walking, not elsewhere classified: Secondary | ICD-10-CM | POA: Diagnosis not present

## 2023-10-08 DIAGNOSIS — M7061 Trochanteric bursitis, right hip: Secondary | ICD-10-CM | POA: Diagnosis not present

## 2023-10-08 DIAGNOSIS — R262 Difficulty in walking, not elsewhere classified: Secondary | ICD-10-CM | POA: Diagnosis not present

## 2023-10-14 DIAGNOSIS — M7061 Trochanteric bursitis, right hip: Secondary | ICD-10-CM | POA: Diagnosis not present

## 2023-10-14 DIAGNOSIS — R262 Difficulty in walking, not elsewhere classified: Secondary | ICD-10-CM | POA: Diagnosis not present
